# Patient Record
Sex: Female | Born: 1980
Health system: Southern US, Community
[De-identification: ages and names within clinical notes are randomized; demographics above are authoritative.]

## PROBLEM LIST (undated history)

## (undated) ENCOUNTER — Inpatient Hospital Stay (HOSPITAL_COMMUNITY): Payer: Self-pay

## (undated) DIAGNOSIS — O09519 Supervision of elderly primigravida, unspecified trimester: Secondary | ICD-10-CM

## (undated) DIAGNOSIS — N390 Urinary tract infection, site not specified: Secondary | ICD-10-CM

## (undated) DIAGNOSIS — D219 Benign neoplasm of connective and other soft tissue, unspecified: Secondary | ICD-10-CM

## (undated) DIAGNOSIS — T8859XA Other complications of anesthesia, initial encounter: Secondary | ICD-10-CM

## (undated) DIAGNOSIS — T4145XA Adverse effect of unspecified anesthetic, initial encounter: Secondary | ICD-10-CM

## (undated) DIAGNOSIS — Z789 Other specified health status: Secondary | ICD-10-CM

## (undated) HISTORY — DX: Other complications of anesthesia, initial encounter: T88.59XA

## (undated) HISTORY — PX: NO PAST SURGERIES: SHX2092

## (undated) HISTORY — DX: Supervision of elderly primigravida, unspecified trimester: O09.519

---
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
## 2017-01-15 ENCOUNTER — Encounter (HOSPITAL_COMMUNITY): Payer: Self-pay

## 2017-01-15 ENCOUNTER — Ambulatory Visit (HOSPITAL_COMMUNITY)
Admission: EM | Admit: 2017-01-15 | Discharge: 2017-01-15 | Disposition: A | Discharge status: Home / Self Care | Payer: 59 | Attending: Family Medicine | Admitting: Family Medicine

## 2017-01-15 DIAGNOSIS — R300 Dysuria: Secondary | ICD-10-CM

## 2017-01-15 DIAGNOSIS — N390 Urinary tract infection, site not specified: Secondary | ICD-10-CM | POA: Diagnosis not present

## 2017-01-15 LAB — POCT URINALYSIS DIP (DEVICE)
Bilirubin Urine: NEGATIVE
GLUCOSE, UA: NEGATIVE mg/dL
KETONES UR: NEGATIVE mg/dL
NITRITE: POSITIVE — AB
Protein, ur: 100 mg/dL — AB
Specific Gravity, Urine: 1.02 (ref 1.005–1.030)
UROBILINOGEN UA: 0.2 mg/dL (ref 0.0–1.0)
pH: 6 (ref 5.0–8.0)

## 2017-01-15 MED ORDER — CEPHALEXIN 500 MG PO CAPS: 500.0000 mg | ORAL_CAPSULE | Freq: Four times a day (QID) | ORAL | 0 refills | Status: DC

## 2017-01-15 MED ORDER — PHENAZOPYRIDINE HCL 200 MG PO TABS: 200.0000 mg | ORAL_TABLET | Freq: Three times a day (TID) | ORAL | 0 refills | Status: DC

## 2017-01-15 MED FILL — CEPHALEXIN 500 MG CAPSULE: 500 | 7 days supply | Qty: 28 | Fill #0

## 2017-01-15 MED FILL — PHENAZOPYRIDINE 200 MG TAB: 200 | 2 days supply | Qty: 6 | Fill #0

## 2017-01-15 NOTE — ED Triage Notes (Signed)
Having uti symptoms since yesterday. Frequency, urgency and dysuria. No fever. Took aleve

## 2017-01-15 NOTE — ED Provider Notes (Signed)
CSN: [id number]     Arrival date & time 01/15/17  1011 History   First MD Initiated Contact with Patient 01/15/17 1039     Chief Complaint  Patient presents with  . Dysuria   (Consider location/radiation/quality/duration/timing/severity/associated sxs/prior Treatment) Patient c/o dysuria for 2 days.   The history is provided by the patient.  Dysuria  Pain quality:  Aching Pain severity:  Mild Onset quality:  Sudden Duration:  2 days Timing:  Constant Recent urinary tract infections: no   Relieved by:  Nothing Worsened by:  Nothing Ineffective treatments:  None tried Urinary symptoms: discolored urine     History reviewed. No pertinent past medical history. History reviewed. No pertinent surgical history. No family history on file. Social History  Substance Use Topics  . Smoking status: Never Smoker  . Smokeless tobacco: Never Used  . Alcohol use No   OB History    No data available     Review of Systems  Constitutional: Positive for fatigue.  HENT: Negative.   Eyes: Negative.   Cardiovascular: Negative.   Gastrointestinal: Negative.   Endocrine: Negative.   Genitourinary: Positive for dysuria.  Allergic/Immunologic: Negative.   Neurological: Negative.   Hematological: Negative.     Allergies  Patient has no known allergies.  Home Medications   Prior to Admission medications   Medication Sig Start Date End Date Taking? Authorizing Provider  cephALEXin (KEFLEX) 500 MG capsule Take 1 capsule (500 mg total) by mouth 4 (four) times daily. 01/15/17   Lysbeth Penner, FNP  phenazopyridine (PYRIDIUM) 200 MG tablet Take 1 tablet (200 mg total) by mouth 3 (three) times daily. 01/15/17   Lysbeth Penner, FNP   Meds Ordered and Administered this Visit  Medications - No data to display  BP 121/76 (BP Location: Right Arm)   Pulse 84   Temp 98.1 F (36.7 C) (Oral)   Resp 20   LMP 01/15/2017 (Exact Date)   SpO2 100%  No data found.   Physical Exam   Constitutional: She appears well-developed and well-nourished.  HENT:  Head: Normocephalic and atraumatic.  Eyes: Conjunctivae and EOM are normal. Pupils are equal, round, and reactive to light.  Neck: Normal range of motion.  Cardiovascular: Normal rate, regular rhythm and normal heart sounds.   Pulmonary/Chest: Effort normal and breath sounds normal.  Abdominal: Soft. Bowel sounds are normal.  Nursing note and vitals reviewed.   Urgent Care Course     Procedures (including critical care time)  Labs Review Labs Reviewed  POCT URINALYSIS DIP (DEVICE) - Abnormal; Notable for the following:       Result Value   Hgb urine dipstick LARGE (*)    Protein, ur 100 (*)    Nitrite POSITIVE (*)    Leukocytes, UA LARGE (*)    All other components within normal limits  URINE CULTURE    Imaging Review No results found.   Visual Acuity Review  Right Eye Distance:   Left Eye Distance:   Bilateral Distance:    Right Eye Near:   Left Eye Near:    Bilateral Near:         MDM   1. Lower urinary tract infectious disease   2. Dysuria    Keflex 500mg  one po qid x 7 days #28 Pyridium 200mg  one po tid x 2 d #6 UA cx    Lysbeth Penner, FNP 01/15/17 1129

## 2017-01-18 ENCOUNTER — Encounter (HOSPITAL_COMMUNITY): Payer: Self-pay | Admitting: Family Medicine

## 2017-01-18 ENCOUNTER — Ambulatory Visit (HOSPITAL_COMMUNITY)
Admission: EM | Admit: 2017-01-18 | Discharge: 2017-01-18 | Disposition: A | Discharge status: Home / Self Care | Payer: 59 | Attending: Family Medicine | Admitting: Family Medicine

## 2017-01-18 DIAGNOSIS — N760 Acute vaginitis: Secondary | ICD-10-CM

## 2017-01-18 DIAGNOSIS — T7840XA Allergy, unspecified, initial encounter: Secondary | ICD-10-CM | POA: Diagnosis not present

## 2017-01-18 LAB — URINE CULTURE: Culture: >=100000 — AB

## 2017-01-18 MED ORDER — FLUCONAZOLE 150 MG PO TABS: 150.0000 mg | ORAL_TABLET | Freq: Once | ORAL | 0 refills | Status: AC

## 2017-01-18 MED ORDER — METHYLPREDNISOLONE ACETATE 80 MG/ML IJ SUSP
80.0000 mg | Freq: Once | INTRAMUSCULAR | Status: AC
  Administered 2017-01-18: 80 mg via INTRAMUSCULAR

## 2017-01-18 MED ORDER — METHYLPREDNISOLONE ACETATE 80 MG/ML IJ SUSP
INTRAMUSCULAR | Status: AC
  Filled 2017-01-18: qty 1

## 2017-01-18 MED ORDER — NITROFURANTOIN MONOHYD MACRO 100 MG PO CAPS: 100.0000 mg | ORAL_CAPSULE | Freq: Two times a day (BID) | ORAL | 0 refills | Status: DC

## 2017-01-18 NOTE — ED Triage Notes (Signed)
Pt here for rash and itching related to allergic reaction to Keflex. sts she started it  Saturday.

## 2017-01-18 NOTE — Discharge Instructions (Signed)
Please return for continued symptoms.

## 2017-01-18 NOTE — ED Provider Notes (Signed)
[facility]    CSN: [id number] Arrival date & time: 01/18/17  [place]     History   Chief Complaint Chief Complaint  Patient presents with  . Allergic Reaction    HPI Erica Lucas is a 36 y.o. female.   This a 36 year old woman who was in several days ago for urinary tract infection and was prescribed Keflex. She's had 2 problems since: Vaginal itching, and skin itching over her entire body which developed over the last 24 hours. Her urinary symptoms are better.      History reviewed. No pertinent past medical history.  There are no active problems to display for this patient.   History reviewed. No pertinent surgical history.  OB History    No data available       Home Medications    Prior to Admission medications   Medication Sig Start Date End Date Taking? Authorizing Provider  cephALEXin (KEFLEX) 500 MG capsule Take 1 capsule (500 mg total) by mouth 4 (four) times daily. 01/15/17   Lysbeth Penner, FNP  fluconazole (DIFLUCAN) 150 MG tablet Take 1 tablet (150 mg total) by mouth once. Repeat if needed 01/18/17 01/18/17  Robyn Haber, MD  nitrofurantoin, macrocrystal-monohydrate, (MACROBID) 100 MG capsule Take 1 capsule (100 mg total) by mouth 2 (two) times daily. 01/18/17   Robyn Haber, MD  phenazopyridine (PYRIDIUM) 200 MG tablet Take 1 tablet (200 mg total) by mouth 3 (three) times daily. 01/15/17   Lysbeth Penner, FNP    Family History History reviewed. No pertinent family history.  Social History Social History  Substance Use Topics  . Smoking status: Never Smoker  . Smokeless tobacco: Never Used  . Alcohol use No     Allergies   Patient has no known allergies.   Review of Systems Review of Systems  Constitutional: Negative.   Genitourinary: Positive for vaginal pain. Negative for frequency, genital sores, hematuria and urgency.  Skin: Positive for rash.     Physical Exam Triage Vital Signs ED Triage Vitals [01/18/17 1804]  Enc  Vitals Group     BP 149/65     Pulse Rate 83     Resp 18     Temp      Temp src      SpO2 100 %     Weight      Height      Head Circumference      Peak Flow      Pain Score      Pain Loc      Pain Edu?      Excl. in [place]?    No data found.   Updated Vital Signs BP 149/65   Pulse 83   Resp 18   LMP 01/15/2017 (Exact Date)   SpO2 100%    Physical Exam  Constitutional: She is oriented to person, place, and time. She appears well-developed and well-nourished.  HENT:  Head: Normocephalic.  Right Ear: External ear normal.  Left Ear: External ear normal.  Mouth/Throat: Oropharynx is clear and moist.  Eyes: Conjunctivae and EOM are normal. Pupils are equal, round, and reactive to light.  Neck: Normal range of motion. Neck supple.  Pulmonary/Chest: Effort normal.  Musculoskeletal: Normal range of motion.  Neurological: She is alert and oriented to person, place, and time.  Skin: Skin is warm and dry. Rash noted.  Diffuse papular urticarial rash over her face and torso  Nursing note and vitals reviewed.    UC Treatments / Results  Labs (all labs ordered are listed, but only abnormal results are displayed) Labs Reviewed - No data to display  EKG  EKG Interpretation None       Radiology No results found.  Procedures Procedures (including critical care time)  Medications Ordered in UC Medications  methylPREDNISolone acetate (DEPO-MEDROL) injection 80 mg (not administered)     Initial Impression / Assessment and Plan / UC Course  I have reviewed the triage vital signs and the nursing notes.  Pertinent labs & imaging results that were available during my care of the patient were reviewed by me and considered in my medical decision making (see chart for details).     Final Clinical Impressions(s) / UC Diagnoses   Final diagnoses:  Allergic reaction, initial encounter  Acute vaginitis    New Prescriptions New Prescriptions   FLUCONAZOLE (DIFLUCAN)  150 MG TABLET    Take 1 tablet (150 mg total) by mouth once. Repeat if needed   NITROFURANTOIN, MACROCRYSTAL-MONOHYDRATE, (MACROBID) 100 MG CAPSULE    Take 1 capsule (100 mg total) by mouth 2 (two) times daily.     Robyn Haber, MD 01/18/17 [phone]

## 2017-01-19 MED FILL — NITROFURANTOIN MONO-MCR 100: 100 | 5 days supply | Qty: 10 | Fill #0

## 2017-01-19 MED FILL — FLUCONAZOLE 150 MG TABLET: 150 | 2 days supply | Qty: 2 | Fill #0

## 2017-04-20 DIAGNOSIS — N911 Secondary amenorrhea: Secondary | ICD-10-CM | POA: Diagnosis not present

## 2017-04-28 DIAGNOSIS — Z3480 Encounter for supervision of other normal pregnancy, unspecified trimester: Secondary | ICD-10-CM | POA: Diagnosis not present

## 2017-04-28 LAB — OB RESULTS CONSOLE GC/CHLAMYDIA
Chlamydia: NEGATIVE
GC PROBE AMP, GENITAL: NEGATIVE

## 2017-04-28 LAB — OB RESULTS CONSOLE ANTIBODY SCREEN: ANTIBODY SCREEN: NEGATIVE

## 2017-04-28 LAB — OB RESULTS CONSOLE RUBELLA ANTIBODY, IGM: RUBELLA: IMMUNE

## 2017-04-28 LAB — OB RESULTS CONSOLE RPR: RPR: NONREACTIVE

## 2017-04-28 LAB — OB RESULTS CONSOLE ABO/RH: RH Type: POSITIVE

## 2017-04-28 LAB — OB RESULTS CONSOLE HIV ANTIBODY (ROUTINE TESTING): HIV: NONREACTIVE

## 2017-04-28 LAB — OB RESULTS CONSOLE HEPATITIS B SURFACE ANTIGEN: Hepatitis B Surface Ag: NEGATIVE

## 2017-05-01 ENCOUNTER — Encounter (HOSPITAL_COMMUNITY): Payer: Self-pay

## 2017-05-01 ENCOUNTER — Inpatient Hospital Stay (HOSPITAL_COMMUNITY)
Admission: AD | Admit: 2017-05-01 | Discharge: 2017-05-01 | Disposition: A | Discharge status: Home / Self Care | Payer: 59 | Source: Ambulatory Visit | Attending: Obstetrics and Gynecology | Admitting: Obstetrics and Gynecology

## 2017-05-01 ENCOUNTER — Inpatient Hospital Stay (HOSPITAL_COMMUNITY): Payer: 59

## 2017-05-01 DIAGNOSIS — Z3A09 9 weeks gestation of pregnancy: Secondary | ICD-10-CM | POA: Insufficient documentation

## 2017-05-01 DIAGNOSIS — Z3A10 10 weeks gestation of pregnancy: Secondary | ICD-10-CM | POA: Diagnosis not present

## 2017-05-01 DIAGNOSIS — R1032 Left lower quadrant pain: Secondary | ICD-10-CM | POA: Insufficient documentation

## 2017-05-01 DIAGNOSIS — O26891 Other specified pregnancy related conditions, first trimester: Secondary | ICD-10-CM | POA: Insufficient documentation

## 2017-05-01 DIAGNOSIS — O3411 Maternal care for benign tumor of corpus uteri, first trimester: Secondary | ICD-10-CM | POA: Insufficient documentation

## 2017-05-01 DIAGNOSIS — R109 Unspecified abdominal pain: Secondary | ICD-10-CM

## 2017-05-01 DIAGNOSIS — D259 Leiomyoma of uterus, unspecified: Secondary | ICD-10-CM | POA: Diagnosis not present

## 2017-05-01 HISTORY — DX: Other specified health status: Z78.9

## 2017-05-01 LAB — CBC
HEMATOCRIT: 32.5 % — AB (ref 36.0–46.0)
Hemoglobin: 11.3 g/dL — ABNORMAL LOW (ref 12.0–15.0)
MCH: 29 pg (ref 26.0–34.0)
MCHC: 34.8 g/dL (ref 30.0–36.0)
MCV: 83.5 fL (ref 78.0–100.0)
PLATELETS: 155 10*3/uL (ref 150–400)
RBC: 3.89 MIL/uL (ref 3.87–5.11)
RDW: 13.8 % (ref 11.5–15.5)
WBC: 8.9 10*3/uL (ref 4.0–10.5)

## 2017-05-01 LAB — URINALYSIS, ROUTINE W REFLEX MICROSCOPIC
BILIRUBIN URINE: NEGATIVE
GLUCOSE, UA: NEGATIVE mg/dL
KETONES UR: 80 mg/dL — AB
NITRITE: NEGATIVE
PH: 6 (ref 5.0–8.0)
PROTEIN: NEGATIVE mg/dL
Specific Gravity, Urine: 1.013 (ref 1.005–1.030)

## 2017-05-01 LAB — COMPREHENSIVE METABOLIC PANEL
ALK PHOS: 38 U/L (ref 38–126)
ALT: 10 U/L — ABNORMAL LOW (ref 14–54)
ANION GAP: 10 (ref 5–15)
AST: 16 U/L (ref 15–41)
Albumin: 3.5 g/dL (ref 3.5–5.0)
BUN: 8 mg/dL (ref 6–20)
CALCIUM: 9.1 mg/dL (ref 8.9–10.3)
CO2: 22 mmol/L (ref 22–32)
Chloride: 102 mmol/L (ref 101–111)
Creatinine, Ser: 0.62 mg/dL (ref 0.44–1.00)
Glucose, Bld: 94 mg/dL (ref 65–99)
Potassium: 3.4 mmol/L — ABNORMAL LOW (ref 3.5–5.1)
Sodium: 134 mmol/L — ABNORMAL LOW (ref 135–145)
Total Bilirubin: 0.7 mg/dL (ref 0.3–1.2)
Total Protein: 7.1 g/dL (ref 6.5–8.1)

## 2017-05-01 LAB — WET PREP, GENITAL
Clue Cells Wet Prep HPF POC: NONE SEEN
SPERM: NONE SEEN
TRICH WET PREP: NONE SEEN

## 2017-05-01 LAB — POCT PREGNANCY, URINE: Preg Test, Ur: POSITIVE — AB

## 2017-05-01 MED ORDER — DEXTROSE 5 % IN LACTATED RINGERS IV BOLUS
1000.0000 mL | Freq: Once | INTRAVENOUS | Status: AC
Start: 2017-05-01 — End: 2017-05-01
  Administered 2017-05-01: 1000 mL via INTRAVENOUS

## 2017-05-01 MED ORDER — PANTOPRAZOLE SODIUM 40 MG IV SOLR
40.0000 mg | Freq: Once | INTRAVENOUS | Status: AC
  Administered 2017-05-01: 40 mg via INTRAVENOUS
  Filled 2017-05-01: qty 40

## 2017-05-01 NOTE — Discharge Instructions (Signed)
Uterine Fibroids Uterine fibroids are tissue masses (tumors) that can develop in the womb (uterus). They are also called leiomyomas. This type of tumor is not cancerous (benign) and does not spread to other parts of the body outside of the pelvic area, which is between the hip bones. Occasionally, fibroids may develop in the fallopian tubes, in the cervix, or on the support structures (ligaments) that surround the uterus. You can have one or many fibroids. Fibroids can vary in size, weight, and where they grow in the uterus. Some can become quite large. Most fibroids do not require medical treatment. What are the causes? A fibroid can develop when a single uterine cell keeps growing (replicating). Most cells in the human body have a control mechanism that keeps them from replicating without control. What are the signs or symptoms? Symptoms may include:  Heavy bleeding during your period.  Bleeding or spotting between periods.  Pelvic pain and pressure.  Bladder problems, such as needing to urinate more often (urinary frequency) or urgently.  Inability to reproduce offspring (infertility).  Miscarriages.  How is this diagnosed? Uterine fibroids are diagnosed through a physical exam. Your health care provider may feel the lumpy tumors during a pelvic exam. Ultrasonography and an MRI may be done to determine the size, location, and number of fibroids. How is this treated? Treatment may include:  Watchful waiting. This involves getting the fibroid checked by your health care provider to see if it grows or shrinks. Follow your health care provider's recommendations for how often to have this checked.  Hormone medicines. These can be taken by mouth or given through an intrauterine device (IUD).  Surgery. ? Removing the fibroids (myomectomy) or the uterus (hysterectomy). ? Removing blood supply to the fibroids (uterine artery embolization).  If fibroids interfere with your fertility and you  want to become pregnant, your health care provider may recommend having the fibroids removed. Follow these instructions at home:  Keep all follow-up visits as directed by your health care provider. This is important.  Take over-the-counter and prescription medicines only as told by your health care provider. ? If you were prescribed a hormone treatment, take the hormone medicines exactly as directed.  Ask your health care provider about taking iron pills and increasing the amount of dark green, leafy vegetables in your diet. These actions can help to boost your blood iron levels, which may be affected by heavy menstrual bleeding.  Pay close attention to your period and tell your health care provider about any changes, such as: ? Increased blood flow that requires you to use more pads or tampons than usual per month. ? A change in the number of days that your period lasts per month. ? A change in symptoms that are associated with your period, such as abdominal cramping or back pain. Contact a health care provider if:  You have pelvic pain, back pain, or abdominal cramps that cannot be controlled with medicines.  You have an increase in bleeding between and during periods.  You soak tampons or pads in a half hour or less.  You feel lightheaded, extra tired, or weak. Get help right away if:  You faint.  You have a sudden increase in pelvic pain. This information is not intended to replace advice given to you by your health care provider. Make sure you discuss any questions you have with your health care provider. Document Released: 10/30/2000 Document Revised: 07/02/2016 Document Reviewed: 05/01/2014 Elsevier Interactive Patient Education  2018 [employer]  

## 2017-05-01 NOTE — MAU Note (Signed)
Low abd pain, very sharp, goes into thighs - started Wednesday. Saw OB on that day and was told might be stretching.  No bleeding. Creamy discharge.

## 2017-05-01 NOTE — MAU Provider Note (Signed)
Chief Complaint: Abdominal Pain   None     SUBJECTIVE HPI: Erica Lucas is a 36 y.o. G1P0 at [redacted]w[redacted]d by LMP who presents to maternity admissions reporting LLQ pain x 5 days, constant, and gradually worsening. She has tried Tylenol which does not help.  She had an [place] in the office 3 days ago which was normal.  Nothing makes the pain better or worse. There are no associated symptoms. The patient is tearful and breathing through pain in the MAU. She denies vaginal bleeding, vaginal itching/burning, urinary symptoms, h/a, dizziness, n/v, or fever/chills.     HPI  Past Medical History:  Diagnosis Date  . Medical history non-contributory    Past Surgical History:  Procedure Laterality Date  . NO PAST SURGERIES     Social History   Social History  . Marital status: Married    Spouse name: N/A  . Number of children: N/A  . Years of education: N/A   Occupational History  . Not on file.   Social History Main Topics  . Smoking status: Never Smoker  . Smokeless tobacco: Never Used  . Alcohol use No  . Drug use: Unknown  . Sexual activity: Not on file   Other Topics Concern  . Not on file   Social History Narrative  . No narrative on file   No current facility-administered medications on file prior to encounter.    Current Outpatient Prescriptions on File Prior to Encounter  Medication Sig Dispense Refill  . cephALEXin (KEFLEX) 500 MG capsule Take 1 capsule (500 mg total) by mouth 4 (four) times daily. 28 capsule 0  . nitrofurantoin, macrocrystal-monohydrate, (MACROBID) 100 MG capsule Take 1 capsule (100 mg total) by mouth 2 (two) times daily. 10 capsule 0  . phenazopyridine (PYRIDIUM) 200 MG tablet Take 1 tablet (200 mg total) by mouth 3 (three) times daily. 6 tablet 0   Allergies  Allergen Reactions  . Keflex [Cephalexin] Rash    ROS:  Review of Systems  Constitutional: Negative for chills, fatigue and fever.  Respiratory: Negative for shortness of breath.    Cardiovascular: Negative for chest pain.  Gastrointestinal: Positive for abdominal pain.  Genitourinary: Positive for pelvic pain. Negative for difficulty urinating, dysuria, flank pain, vaginal bleeding, vaginal discharge and vaginal pain.  Neurological: Negative for dizziness and headaches.  Psychiatric/Behavioral: Negative.      I have reviewed patient's Past Medical Hx, Surgical Hx, Family Hx, Social Hx, medications and allergies.   Physical Exam  Patient Vitals for the past 24 hrs:  BP Temp Temp src Pulse Resp SpO2 Height  05/01/17 0626 108/67 99.6 F (37.6 C) Oral (!) 101 18 99 % 5\' 7"  (1.702 m)   Constitutional: Well-developed, well-nourished female in no acute distress.  Cardiovascular: normal rate Respiratory: normal effort GI: Abd soft, non-tender. Pos BS x 4 MS: Extremities nontender, no edema, normal ROM Neurologic: Alert and oriented x 4.  GU: Neg CVAT.  PELVIC EXAM: Wet prep/GC collected by blind swab   LAB RESULTS Results for orders placed or performed during the hospital encounter of 05/01/17 (from the past 24 hour(s))  Urinalysis, Routine w reflex microscopic     Status: Abnormal   Collection Time: 05/01/17  6:10 AM  Result Value Ref Range   Color, Urine YELLOW YELLOW   APPearance HAZY (A) CLEAR   Specific Gravity, Urine 1.013 1.005 - 1.030   pH 6.0 5.0 - 8.0   Glucose, UA NEGATIVE NEGATIVE mg/dL   Hgb urine dipstick SMALL (A) NEGATIVE  Bilirubin Urine NEGATIVE NEGATIVE   Ketones, ur 80 (A) NEGATIVE mg/dL   Protein, ur NEGATIVE NEGATIVE mg/dL   Nitrite NEGATIVE NEGATIVE   Leukocytes, UA TRACE (A) NEGATIVE   RBC / HPF 0-5 0 - 5 RBC/hpf   WBC, UA 0-5 0 - 5 WBC/hpf   Bacteria, UA MANY (A) NONE SEEN   Squamous Epithelial / LPF 0-5 (A) NONE SEEN   Mucous PRESENT   Pregnancy, urine POC     Status: Abnormal   Collection Time: 05/01/17  6:45 AM  Result Value Ref Range   Preg Test, Ur POSITIVE (A) NEGATIVE  CBC     Status: Abnormal   Collection Time:  05/01/17  8:20 AM  Result Value Ref Range   WBC 8.9 4.0 - 10.5 K/uL   RBC 3.89 3.87 - 5.11 MIL/uL   Hemoglobin 11.3 (L) 12.0 - 15.0 g/dL   HCT 32.5 (L) 36.0 - 46.0 %   MCV 83.5 78.0 - 100.0 fL   MCH 29.0 26.0 - 34.0 pg   MCHC 34.8 30.0 - 36.0 g/dL   RDW 13.8 11.5 - 15.5 %   Platelets 155 150 - 400 K/uL       IMAGING Bedside US with FHR 176, fetal movement noted, subjectively normal fluid level  No results found.  Report to Jorje Guild, NP, with [place] results pending   Fatima Blank Certified Nurse-Midwife 05/01/2017  8:43 AM   U/a shows 80 ketones & pt reports not eating or drinking much d/t nausea. IV fluids bolus of D5LR given.  Ultrasound shows IUP with cardiac activity & 10 cm uterine fibroid Discussed results with Dr. Matthew Saras. Patient has f/u appointment with Dr. Corinna Capra next week. Ok to discharge home.   A: 1. Uterine fibroids affecting pregnancy in first trimester   2. LLQ pain   3. Abdominal pain during pregnancy, first trimester    P: Discharge home Take tylenol prn pain Discussed reasons to return to MAU Keep f/u with OB  Jorje Guild, NP

## 2017-05-03 DIAGNOSIS — O26891 Other specified pregnancy related conditions, first trimester: Secondary | ICD-10-CM | POA: Diagnosis not present

## 2017-05-03 DIAGNOSIS — Z3A10 10 weeks gestation of pregnancy: Secondary | ICD-10-CM | POA: Diagnosis not present

## 2017-05-03 DIAGNOSIS — Z3400 Encounter for supervision of normal first pregnancy, unspecified trimester: Secondary | ICD-10-CM | POA: Diagnosis not present

## 2017-05-03 DIAGNOSIS — R1030 Lower abdominal pain, unspecified: Secondary | ICD-10-CM | POA: Diagnosis not present

## 2017-05-03 LAB — GC/CHLAMYDIA PROBE AMP (~~LOC~~) NOT AT ARMC
CHLAMYDIA, DNA PROBE: NEGATIVE
Neisseria Gonorrhea: NEGATIVE

## 2017-05-07 DIAGNOSIS — Z3480 Encounter for supervision of other normal pregnancy, unspecified trimester: Secondary | ICD-10-CM | POA: Diagnosis not present

## 2017-05-07 DIAGNOSIS — Z3401 Encounter for supervision of normal first pregnancy, first trimester: Secondary | ICD-10-CM | POA: Diagnosis not present

## 2017-05-07 DIAGNOSIS — Z36 Encounter for antenatal screening for chromosomal anomalies: Secondary | ICD-10-CM | POA: Diagnosis not present

## 2017-05-17 DIAGNOSIS — O09511 Supervision of elderly primigravida, first trimester: Secondary | ICD-10-CM | POA: Diagnosis not present

## 2017-05-17 DIAGNOSIS — Z363 Encounter for antenatal screening for malformations: Secondary | ICD-10-CM | POA: Diagnosis not present

## 2017-05-17 DIAGNOSIS — Z3A12 12 weeks gestation of pregnancy: Secondary | ICD-10-CM | POA: Diagnosis not present

## 2017-05-17 DIAGNOSIS — Z3682 Encounter for antenatal screening for nuchal translucency: Secondary | ICD-10-CM | POA: Diagnosis not present

## 2017-06-23 DIAGNOSIS — Z3480 Encounter for supervision of other normal pregnancy, unspecified trimester: Secondary | ICD-10-CM | POA: Diagnosis not present

## 2017-07-11 ENCOUNTER — Encounter (HOSPITAL_COMMUNITY): Payer: Self-pay | Admitting: *Deleted

## 2017-07-11 ENCOUNTER — Inpatient Hospital Stay (HOSPITAL_COMMUNITY)
Admission: AD | Admit: 2017-07-11 | Discharge: 2017-07-12 | Disposition: A | Discharge status: Home / Self Care | Payer: 59 | Source: Ambulatory Visit | Attending: Obstetrics & Gynecology | Admitting: Obstetrics & Gynecology

## 2017-07-11 DIAGNOSIS — Z3A20 20 weeks gestation of pregnancy: Secondary | ICD-10-CM | POA: Diagnosis not present

## 2017-07-11 DIAGNOSIS — O9989 Other specified diseases and conditions complicating pregnancy, childbirth and the puerperium: Secondary | ICD-10-CM | POA: Diagnosis not present

## 2017-07-11 DIAGNOSIS — R102 Pelvic and perineal pain: Secondary | ICD-10-CM | POA: Diagnosis not present

## 2017-07-11 DIAGNOSIS — R109 Unspecified abdominal pain: Secondary | ICD-10-CM | POA: Diagnosis not present

## 2017-07-11 DIAGNOSIS — O3412 Maternal care for benign tumor of corpus uteri, second trimester: Secondary | ICD-10-CM | POA: Insufficient documentation

## 2017-07-11 DIAGNOSIS — O3410 Maternal care for benign tumor of corpus uteri, unspecified trimester: Secondary | ICD-10-CM

## 2017-07-11 DIAGNOSIS — Z3A19 19 weeks gestation of pregnancy: Secondary | ICD-10-CM

## 2017-07-11 DIAGNOSIS — D259 Leiomyoma of uterus, unspecified: Secondary | ICD-10-CM

## 2017-07-11 HISTORY — DX: Benign neoplasm of connective and other soft tissue, unspecified: D21.9

## 2017-07-11 HISTORY — DX: Urinary tract infection, site not specified: N39.0

## 2017-07-11 LAB — URINALYSIS, ROUTINE W REFLEX MICROSCOPIC
Bilirubin Urine: NEGATIVE
Glucose, UA: NEGATIVE mg/dL
HGB URINE DIPSTICK: NEGATIVE
Ketones, ur: NEGATIVE mg/dL
LEUKOCYTES UA: NEGATIVE
Nitrite: NEGATIVE
PROTEIN: NEGATIVE mg/dL
Specific Gravity, Urine: 1.013 (ref 1.005–1.030)
pH: 7 (ref 5.0–8.0)

## 2017-07-11 MED ORDER — ACETAMINOPHEN 500 MG PO TABS
1000.0000 mg | ORAL_TABLET | Freq: Once | ORAL | Status: AC
  Administered 2017-07-12: 500 mg via ORAL
  Filled 2017-07-11: qty 2

## 2017-07-11 NOTE — MAU Provider Note (Signed)
History     CSN: [id number]  Arrival date and time: 07/11/17 2253   First Provider Initiated Contact with Patient 07/11/17 2333      Chief Complaint  Patient presents with  . Abdominal Pain    HPI: Erica Lucas is a 36 y.o. G1P0 with IUP at [redacted]w[redacted]d who presents to maternity admissions reporting abdominal pain. She reports cramping pain on her mid-abdomen which started yesterday. She reports she thought this was d/t activity yesterday, but since around 5pm today, pain has become severe, and coming every 10 minutes. Reports that when she gets pain, she also get some rectal pain and the urge to urinate. Denies dysuria, hematuria or urinary frequency. Also denies vaginal discharge or vaginal bleeding, fever, chills, nausea, vomiting, or diarrhea. She reports she has been constipation since starting PNV, but had a small BM today.   Past obstetric history: OB History  Gravida Para Term Preterm AB Living  1            SAB TAB Ectopic Multiple Live Births               # Outcome Date GA Lbr Len/2nd Weight Sex Delivery Anes PTL Lv  1 Current               Past Medical History:  Diagnosis Date  . Fibroid   . Medical history non-contributory   . UTI (urinary tract infection)     Past Surgical History:  Procedure Laterality Date  . NO PAST SURGERIES      Family History  Problem Relation Age of Onset  . Hypertension Mother   . Diabetes Mother     Social History  Substance Use Topics  . Smoking status: Never Smoker  . Smokeless tobacco: Never Used  . Alcohol use No    Allergies:  Allergies  Allergen Reactions  . Keflex [Cephalexin] Rash    Prescriptions Prior to Admission  Medication Sig Dispense Refill Last Dose  . Prenatal Vit-Fe Fumarate-FA (PRENATAL MULTIVITAMIN) TABS tablet Take 1 tablet by mouth daily at 12 noon.   Past Week at Unknown time    Review of Systems - Negative except for what is mentioned in HPI.  Physical Exam   Blood pressure 113/60, pulse (!)  102, temperature 98.4 F (36.9 C), resp. rate 18, height 5\' 6"  (1.676 m), weight 152 lb (68.9 kg), last menstrual period 01/15/2017, SpO2 100 %.  Constitutional: Patient initially in NAD, but intermittently in pain, moaning, and very uncomfortable with pelvic exam Cardiovascular: normal rate, regular rythm Respiratory: normal effort, lungs CTAB.  GI: Abd soft, gravid, fundus about 4cm above umbilicus. Diffuse TTP, no rebound or guarding. Negative obturator and psoas signs. GU: Neg CVAT. Pelvic: NEFG. Whitish discharge in vaginal vault, otherwise normal appearing.  vagina mucosa; cervix visually closed. No CMT. Mild R adnexal tenderness MSK: Extremities nontender, no edema, normal ROM Neurologic: Alert and oriented x 4. Psych: Normal mood and affect   +FHT by doppler  MAU Course  Procedures  MDM 12:12 AM -- Exam as above. Wet prep and GC/Chlamydia collected. CBC, CMP, lipase, and U/S ordered.  01:00 AM -- Labs and ultrasound reviewed Results for orders placed or performed during the hospital encounter of 07/11/17  Wet prep, genital  Result Value Ref Range   Yeast Wet Prep HPF POC NONE SEEN NONE SEEN   Trich, Wet Prep NONE SEEN NONE SEEN   Clue Cells Wet Prep HPF POC NONE SEEN NONE SEEN   WBC, Wet Prep  HPF POC FEW (A) NONE SEEN   Sperm NONE SEEN   Urinalysis, Routine w reflex microscopic  Result Value Ref Range   Color, Urine STRAW (A) YELLOW   APPearance CLEAR CLEAR   Specific Gravity, Urine 1.013 1.005 - 1.030   pH 7.0 5.0 - 8.0   Glucose, UA NEGATIVE NEGATIVE mg/dL   Hgb urine dipstick NEGATIVE NEGATIVE   Bilirubin Urine NEGATIVE NEGATIVE   Ketones, ur NEGATIVE NEGATIVE mg/dL   Protein, ur NEGATIVE NEGATIVE mg/dL   Nitrite NEGATIVE NEGATIVE   Leukocytes, UA NEGATIVE NEGATIVE  Comprehensive metabolic panel  Result Value Ref Range   Sodium 135 135 - 145 mmol/L   Potassium 3.6 3.5 - 5.1 mmol/L   Chloride 106 101 - 111 mmol/L   CO2 22 22 - 32 mmol/L   Glucose, Bld 98 65 -  99 mg/dL   BUN 8 6 - 20 mg/dL   Creatinine, Ser 0.48 0.44 - 1.00 mg/dL   Calcium 8.3 (L) 8.9 - 10.3 mg/dL   Total Protein 5.9 (L) 6.5 - 8.1 g/dL   Albumin 2.9 (L) 3.5 - 5.0 g/dL   AST 20 15 - 41 U/L   ALT 13 (L) 14 - 54 U/L   Alkaline Phosphatase 43 38 - 126 U/L   Total Bilirubin 0.5 0.3 - 1.2 mg/dL   GFR calc non Af Amer >60 >60 mL/min   GFR calc Af Amer >60 >60 mL/min   Anion gap 7 5 - 15  Lipase, blood  Result Value Ref Range   Lipase 34 11 - 51 U/L  CBC with Differential/Platelet  Result Value Ref Range   WBC 8.2 4.0 - 10.5 K/uL   RBC 3.66 (L) 3.87 - 5.11 MIL/uL   Hemoglobin 11.0 (L) 12.0 - 15.0 g/dL   HCT 32.2 (L) 36.0 - 46.0 %   MCV 88.0 78.0 - 100.0 fL   MCH 30.1 26.0 - 34.0 pg   MCHC 34.2 30.0 - 36.0 g/dL   RDW 16.5 (H) 11.5 - 15.5 %   Platelets 152 150 - 400 K/uL   Neutrophils Relative % 81 %   Neutro Abs 6.7 1.7 - 7.7 K/uL   Lymphocytes Relative 15 %   Lymphs Abs 1.2 0.7 - 4.0 K/uL   Monocytes Relative 3 %   Monocytes Absolute 0.2 0.1 - 1.0 K/uL   Eosinophils Relative 1 %   Eosinophils Absolute 0.0 0.0 - 0.7 K/uL   Basophils Relative 0 %   Basophils Absolute 0.0 0.0 - 0.1 K/uL   Labs wnl, no leukocytosis; CMP and lipase wnl. U/S unremarkable other than known uterine fibroid. CL 4 cm. Placenta anterior w/o sign of abruption. Adnexa wnl.   Discussed with Dr. Lynnette Caffey, who recommends trial of ibuprofen x 2 days. Patient has follow up tomorrow.   Assessment and Plan  Assessment: 1. Uterine fibroid in pregnancy   2. Pelvic pain   3. Pregnancy with 19 completed weeks gestation     Plan: --Ibuprofen for fibroid pain as recommended by Dr. Lynnette Caffey. --Discharge home in stable condition.  --PTL precautions --Follow up with OB as scheduled tomorrow   Kipp Shank, Jenne Pane, MD 07/11/2017 11:58 PM

## 2017-07-11 NOTE — MAU Note (Signed)
Pt is reporting contraction like pain and rectal pressure that started around 5pm. States the pain is around every 10 mins. Pt denies vaginal bleeding or discharge. Pt denies urinary s/s other than "really concentrated urine." Pt reports constipation-last BM was today.

## 2017-07-12 ENCOUNTER — Inpatient Hospital Stay (HOSPITAL_COMMUNITY): Payer: 59

## 2017-07-12 DIAGNOSIS — Z363 Encounter for antenatal screening for malformations: Secondary | ICD-10-CM | POA: Diagnosis not present

## 2017-07-12 DIAGNOSIS — D259 Leiomyoma of uterus, unspecified: Secondary | ICD-10-CM | POA: Diagnosis not present

## 2017-07-12 DIAGNOSIS — R102 Pelvic and perineal pain: Secondary | ICD-10-CM | POA: Diagnosis not present

## 2017-07-12 DIAGNOSIS — O9989 Other specified diseases and conditions complicating pregnancy, childbirth and the puerperium: Secondary | ICD-10-CM | POA: Diagnosis not present

## 2017-07-12 DIAGNOSIS — Z3400 Encounter for supervision of normal first pregnancy, unspecified trimester: Secondary | ICD-10-CM | POA: Diagnosis not present

## 2017-07-12 DIAGNOSIS — Z3689 Encounter for other specified antenatal screening: Secondary | ICD-10-CM | POA: Diagnosis not present

## 2017-07-12 DIAGNOSIS — Z3A19 19 weeks gestation of pregnancy: Secondary | ICD-10-CM | POA: Diagnosis not present

## 2017-07-12 DIAGNOSIS — O26892 Other specified pregnancy related conditions, second trimester: Secondary | ICD-10-CM | POA: Diagnosis not present

## 2017-07-12 DIAGNOSIS — Z3A20 20 weeks gestation of pregnancy: Secondary | ICD-10-CM | POA: Diagnosis not present

## 2017-07-12 DIAGNOSIS — O3412 Maternal care for benign tumor of corpus uteri, second trimester: Secondary | ICD-10-CM | POA: Diagnosis not present

## 2017-07-12 DIAGNOSIS — R109 Unspecified abdominal pain: Secondary | ICD-10-CM | POA: Diagnosis not present

## 2017-07-12 LAB — COMPREHENSIVE METABOLIC PANEL
ALK PHOS: 43 U/L (ref 38–126)
ALT: 13 U/L — ABNORMAL LOW (ref 14–54)
ANION GAP: 7 (ref 5–15)
AST: 20 U/L (ref 15–41)
Albumin: 2.9 g/dL — ABNORMAL LOW (ref 3.5–5.0)
BILIRUBIN TOTAL: 0.5 mg/dL (ref 0.3–1.2)
BUN: 8 mg/dL (ref 6–20)
CALCIUM: 8.3 mg/dL — AB (ref 8.9–10.3)
CO2: 22 mmol/L (ref 22–32)
Chloride: 106 mmol/L (ref 101–111)
Creatinine, Ser: 0.48 mg/dL (ref 0.44–1.00)
Glucose, Bld: 98 mg/dL (ref 65–99)
Potassium: 3.6 mmol/L (ref 3.5–5.1)
Sodium: 135 mmol/L (ref 135–145)
TOTAL PROTEIN: 5.9 g/dL — AB (ref 6.5–8.1)

## 2017-07-12 LAB — CBC WITH DIFFERENTIAL/PLATELET
BASOS PCT: 0 %
Basophils Absolute: 0 10*3/uL (ref 0.0–0.1)
EOS ABS: 0 10*3/uL (ref 0.0–0.7)
Eosinophils Relative: 1 %
HCT: 32.2 % — ABNORMAL LOW (ref 36.0–46.0)
HEMOGLOBIN: 11 g/dL — AB (ref 12.0–15.0)
Lymphocytes Relative: 15 %
Lymphs Abs: 1.2 10*3/uL (ref 0.7–4.0)
MCH: 30.1 pg (ref 26.0–34.0)
MCHC: 34.2 g/dL (ref 30.0–36.0)
MCV: 88 fL (ref 78.0–100.0)
Monocytes Absolute: 0.2 10*3/uL (ref 0.1–1.0)
Monocytes Relative: 3 %
NEUTROS PCT: 81 %
Neutro Abs: 6.7 10*3/uL (ref 1.7–7.7)
Platelets: 152 10*3/uL (ref 150–400)
RBC: 3.66 MIL/uL — AB (ref 3.87–5.11)
RDW: 16.5 % — ABNORMAL HIGH (ref 11.5–15.5)
WBC: 8.2 10*3/uL (ref 4.0–10.5)

## 2017-07-12 LAB — WET PREP, GENITAL
Clue Cells Wet Prep HPF POC: NONE SEEN
SPERM: NONE SEEN
Trich, Wet Prep: NONE SEEN
Yeast Wet Prep HPF POC: NONE SEEN

## 2017-07-12 LAB — GC/CHLAMYDIA PROBE AMP (~~LOC~~) NOT AT ARMC
CHLAMYDIA, DNA PROBE: NEGATIVE
NEISSERIA GONORRHEA: NEGATIVE

## 2017-07-12 LAB — LIPASE, BLOOD: LIPASE: 34 U/L (ref 11–51)

## 2017-07-12 MED ORDER — BETAMETHASONE SOD PHOS & ACET 6 (3-3) MG/ML IJ SUSP: 12.0000 mg | Freq: Once | INTRAMUSCULAR | Status: DC

## 2017-07-12 MED ORDER — IBUPROFEN 600 MG PO TABS: 600.0000 mg | ORAL_TABLET | Freq: Four times a day (QID) | ORAL | 0 refills | Status: DC | PRN

## 2017-07-13 LAB — URINE CULTURE: Culture: <10000 — AB

## 2017-07-21 DIAGNOSIS — Z3A21 21 weeks gestation of pregnancy: Secondary | ICD-10-CM | POA: Diagnosis not present

## 2017-07-21 DIAGNOSIS — O26892 Other specified pregnancy related conditions, second trimester: Secondary | ICD-10-CM | POA: Diagnosis not present

## 2017-07-21 DIAGNOSIS — O3412 Maternal care for benign tumor of corpus uteri, second trimester: Secondary | ICD-10-CM | POA: Diagnosis not present

## 2017-07-21 DIAGNOSIS — Z3400 Encounter for supervision of normal first pregnancy, unspecified trimester: Secondary | ICD-10-CM | POA: Diagnosis not present

## 2017-08-30 DIAGNOSIS — Z3480 Encounter for supervision of other normal pregnancy, unspecified trimester: Secondary | ICD-10-CM | POA: Diagnosis not present

## 2017-08-30 DIAGNOSIS — Z3400 Encounter for supervision of normal first pregnancy, unspecified trimester: Secondary | ICD-10-CM | POA: Diagnosis not present

## 2017-08-30 DIAGNOSIS — Z23 Encounter for immunization: Secondary | ICD-10-CM | POA: Diagnosis not present

## 2017-09-15 DIAGNOSIS — Z3A29 29 weeks gestation of pregnancy: Secondary | ICD-10-CM | POA: Diagnosis not present

## 2017-09-15 DIAGNOSIS — O3413 Maternal care for benign tumor of corpus uteri, third trimester: Secondary | ICD-10-CM | POA: Diagnosis not present

## 2017-09-15 DIAGNOSIS — Z3400 Encounter for supervision of normal first pregnancy, unspecified trimester: Secondary | ICD-10-CM | POA: Diagnosis not present

## 2017-09-29 DIAGNOSIS — D649 Anemia, unspecified: Secondary | ICD-10-CM | POA: Diagnosis not present

## 2017-09-29 DIAGNOSIS — Z3403 Encounter for supervision of normal first pregnancy, third trimester: Secondary | ICD-10-CM | POA: Diagnosis not present

## 2017-10-14 DIAGNOSIS — Z23 Encounter for immunization: Secondary | ICD-10-CM | POA: Diagnosis not present

## 2017-10-14 DIAGNOSIS — Z3400 Encounter for supervision of normal first pregnancy, unspecified trimester: Secondary | ICD-10-CM | POA: Diagnosis not present

## 2017-10-14 DIAGNOSIS — Z3A33 33 weeks gestation of pregnancy: Secondary | ICD-10-CM | POA: Diagnosis not present

## 2017-10-14 DIAGNOSIS — O3413 Maternal care for benign tumor of corpus uteri, third trimester: Secondary | ICD-10-CM | POA: Diagnosis not present

## 2017-10-28 DIAGNOSIS — Z3685 Encounter for antenatal screening for Streptococcus B: Secondary | ICD-10-CM | POA: Diagnosis not present

## 2017-11-02 LAB — OB RESULTS CONSOLE GBS: STREP GROUP B AG: NEGATIVE

## 2017-11-26 ENCOUNTER — Encounter (HOSPITAL_COMMUNITY): Payer: Self-pay | Admitting: *Deleted

## 2017-11-26 ENCOUNTER — Telehealth (HOSPITAL_COMMUNITY): Payer: Self-pay | Admitting: *Deleted

## 2017-11-26 NOTE — Telephone Encounter (Signed)
Preadmission screen  

## 2017-12-02 ENCOUNTER — Inpatient Hospital Stay (HOSPITAL_COMMUNITY): Payer: 59

## 2017-12-06 ENCOUNTER — Inpatient Hospital Stay (HOSPITAL_COMMUNITY)
Admission: RE | Admit: 2017-12-06 | Discharge: 2017-12-08 | DRG: 788 | Disposition: A | Discharge status: Home / Self Care | Payer: 59 | Source: Ambulatory Visit | Attending: Obstetrics and Gynecology | Admitting: Obstetrics and Gynecology

## 2017-12-06 ENCOUNTER — Encounter (HOSPITAL_COMMUNITY)
Admission: RE | Disposition: A | Discharge status: Home / Self Care | Payer: Self-pay | Source: Ambulatory Visit | Attending: Obstetrics and Gynecology

## 2017-12-06 ENCOUNTER — Encounter (HOSPITAL_COMMUNITY): Payer: Self-pay

## 2017-12-06 ENCOUNTER — Inpatient Hospital Stay (HOSPITAL_COMMUNITY): Payer: 59 | Admitting: Anesthesiology

## 2017-12-06 DIAGNOSIS — Z883 Allergy status to other anti-infective agents status: Secondary | ICD-10-CM | POA: Diagnosis not present

## 2017-12-06 DIAGNOSIS — O3413 Maternal care for benign tumor of corpus uteri, third trimester: Principal | ICD-10-CM | POA: Diagnosis present

## 2017-12-06 DIAGNOSIS — D259 Leiomyoma of uterus, unspecified: Secondary | ICD-10-CM | POA: Diagnosis present

## 2017-12-06 DIAGNOSIS — Z3A40 40 weeks gestation of pregnancy: Secondary | ICD-10-CM

## 2017-12-06 DIAGNOSIS — Z3483 Encounter for supervision of other normal pregnancy, third trimester: Secondary | ICD-10-CM | POA: Diagnosis present

## 2017-12-06 DIAGNOSIS — Z3490 Encounter for supervision of normal pregnancy, unspecified, unspecified trimester: Secondary | ICD-10-CM

## 2017-12-06 LAB — CBC
HCT: 34.1 % — ABNORMAL LOW (ref 36.0–46.0)
HEMATOCRIT: 36.4 % (ref 36.0–46.0)
HEMOGLOBIN: 11.6 g/dL — AB (ref 12.0–15.0)
Hemoglobin: 12.3 g/dL (ref 12.0–15.0)
MCH: 31.9 pg (ref 26.0–34.0)
MCH: 31.6 pg (ref 26.0–34.0)
MCHC: 33.8 g/dL (ref 30.0–36.0)
MCHC: 34 g/dL (ref 30.0–36.0)
MCV: 94.5 fL (ref 78.0–100.0)
MCV: 92.9 fL (ref 78.0–100.0)
PLATELETS: 144 10*3/uL — AB (ref 150–400)
Platelets: 134 10*3/uL — ABNORMAL LOW (ref 150–400)
RBC: 3.85 MIL/uL — ABNORMAL LOW (ref 3.87–5.11)
RBC: 3.67 MIL/uL — AB (ref 3.87–5.11)
RDW: 14.9 % (ref 11.5–15.5)
RDW: 14.9 % (ref 11.5–15.5)
WBC: 10.8 10*3/uL — AB (ref 4.0–10.5)
WBC: 14.1 10*3/uL — ABNORMAL HIGH (ref 4.0–10.5)

## 2017-12-06 LAB — ABO/RH: ABO/RH(D): B POS

## 2017-12-06 LAB — RPR: RPR Ser Ql: NONREACTIVE

## 2017-12-06 LAB — TYPE AND SCREEN
ABO/RH(D): B POS
ANTIBODY SCREEN: NEGATIVE

## 2017-12-06 SURGERY — Surgical Case
Anesthesia: Epidural

## 2017-12-06 MED ORDER — PHENYLEPHRINE HCL 10 MG/ML IJ SOLN
INTRAMUSCULAR | Status: DC | PRN
  Administered 2017-12-06 (×8): 80 ug via INTRAVENOUS
  Administered 2017-12-06: 120 ug via INTRAVENOUS
  Administered 2017-12-06 (×2): 80 ug via INTRAVENOUS

## 2017-12-06 MED ORDER — LACTATED RINGERS IV SOLN
INTRAVENOUS | Status: DC
  Administered 2017-12-06 (×5): via INTRAVENOUS

## 2017-12-06 MED ORDER — DEXAMETHASONE SODIUM PHOSPHATE 10 MG/ML IJ SOLN
INTRAMUSCULAR | Status: AC
  Filled 2017-12-06: qty 1

## 2017-12-06 MED ORDER — SOD CITRATE-CITRIC ACID 500-334 MG/5ML PO SOLN: 30.0000 mL | ORAL | Status: DC

## 2017-12-06 MED ORDER — SIMETHICONE 80 MG PO CHEW
80.0000 mg | CHEWABLE_TABLET | ORAL | Status: DC
  Filled 2017-12-06: qty 1

## 2017-12-06 MED ORDER — ONDANSETRON HCL 4 MG/2ML IJ SOLN
INTRAMUSCULAR | Status: DC | PRN
  Administered 2017-12-06: 4 mg via INTRAVENOUS

## 2017-12-06 MED ORDER — OXYCODONE-ACETAMINOPHEN 5-325 MG PO TABS: 2.0000 | ORAL_TABLET | ORAL | Status: DC | PRN

## 2017-12-06 MED ORDER — OXYCODONE HCL 5 MG PO TABS
ORAL_TABLET | ORAL | Status: AC
  Filled 2017-12-06: qty 1

## 2017-12-06 MED ORDER — MISOPROSTOL 25 MCG QUARTER TABLET
25.0000 ug | ORAL_TABLET | ORAL | Status: DC | PRN
  Administered 2017-12-06 (×2): 25 ug via VAGINAL
  Filled 2017-12-06 (×2): qty 1

## 2017-12-06 MED ORDER — GENTAMICIN SULFATE 40 MG/ML IJ SOLN
INTRAVENOUS | Status: AC
  Administered 2017-12-06: 116 mL via INTRAVENOUS
  Filled 2017-12-06: qty 10

## 2017-12-06 MED ORDER — FENTANYL 2.5 MCG/ML BUPIVACAINE 1/10 % EPIDURAL INFUSION (WH - ANES): 14.0000 mL/h | INTRAMUSCULAR | Status: DC | PRN

## 2017-12-06 MED ORDER — DIPHENHYDRAMINE HCL 25 MG PO CAPS
25.0000 mg | ORAL_CAPSULE | ORAL | Status: DC | PRN
  Administered 2017-12-07: 25 mg via ORAL
  Filled 2017-12-06: qty 1

## 2017-12-06 MED ORDER — MENTHOL 3 MG MT LOZG: 1.0000 | LOZENGE | OROMUCOSAL | Status: DC | PRN

## 2017-12-06 MED ORDER — NALBUPHINE HCL 10 MG/ML IJ SOLN
5.0000 mg | INTRAMUSCULAR | Status: DC | PRN
  Administered 2017-12-07: 5 mg via INTRAVENOUS
  Filled 2017-12-06: qty 1

## 2017-12-06 MED ORDER — HYDROMORPHONE HCL 1 MG/ML IJ SOLN: 0.2500 mg | INTRAMUSCULAR | Status: DC | PRN

## 2017-12-06 MED ORDER — DIPHENHYDRAMINE HCL 25 MG PO CAPS: 25.0000 mg | ORAL_CAPSULE | Freq: Four times a day (QID) | ORAL | Status: DC | PRN

## 2017-12-06 MED ORDER — ONDANSETRON HCL 4 MG/2ML IJ SOLN: 4.0000 mg | Freq: Three times a day (TID) | INTRAMUSCULAR | Status: DC | PRN

## 2017-12-06 MED ORDER — OXYTOCIN 40 UNITS IN LACTATED RINGERS INFUSION - SIMPLE MED: 2.5000 [IU]/h | INTRAVENOUS | Status: AC

## 2017-12-06 MED ORDER — PHENYLEPHRINE 40 MCG/ML (10ML) SYRINGE FOR IV PUSH (FOR BLOOD PRESSURE SUPPORT)
80.0000 ug | PREFILLED_SYRINGE | INTRAVENOUS | Status: DC | PRN
  Filled 2017-12-06: qty 10

## 2017-12-06 MED ORDER — PHENYLEPHRINE 40 MCG/ML (10ML) SYRINGE FOR IV PUSH (FOR BLOOD PRESSURE SUPPORT): 80.0000 ug | PREFILLED_SYRINGE | INTRAVENOUS | Status: DC | PRN

## 2017-12-06 MED ORDER — NALBUPHINE HCL 10 MG/ML IJ SOLN: 5.0000 mg | Freq: Once | INTRAMUSCULAR | Status: DC | PRN

## 2017-12-06 MED ORDER — WITCH HAZEL-GLYCERIN EX PADS: 1.0000 "application " | MEDICATED_PAD | CUTANEOUS | Status: DC | PRN

## 2017-12-06 MED ORDER — PHENYLEPHRINE 40 MCG/ML (10ML) SYRINGE FOR IV PUSH (FOR BLOOD PRESSURE SUPPORT)
PREFILLED_SYRINGE | INTRAVENOUS | Status: AC
  Filled 2017-12-06: qty 30

## 2017-12-06 MED ORDER — MORPHINE SULFATE (PF) 0.5 MG/ML IJ SOLN
INTRAMUSCULAR | Status: DC | PRN
  Administered 2017-12-06: 4 mg via EPIDURAL

## 2017-12-06 MED ORDER — KETOROLAC TROMETHAMINE 30 MG/ML IJ SOLN
INTRAMUSCULAR | Status: AC
  Administered 2017-12-06: 30 mg via INTRAMUSCULAR
  Filled 2017-12-06: qty 1

## 2017-12-06 MED ORDER — CLINDAMYCIN PHOSPHATE 900 MG/50ML IV SOLN: 900.0000 mg | INTRAVENOUS | Status: DC

## 2017-12-06 MED ORDER — OXYTOCIN 10 UNIT/ML IJ SOLN
INTRAVENOUS | Status: DC | PRN
  Administered 2017-12-06: 40 [IU] via INTRAVENOUS

## 2017-12-06 MED ORDER — FENTANYL CITRATE (PF) 100 MCG/2ML IJ SOLN
100.0000 ug | INTRAMUSCULAR | Status: DC | PRN
  Administered 2017-12-06: 100 ug via INTRAVENOUS
  Filled 2017-12-06: qty 2

## 2017-12-06 MED ORDER — FLEET ENEMA 7-19 GM/118ML RE ENEM: 1.0000 | ENEMA | RECTAL | Status: DC | PRN

## 2017-12-06 MED ORDER — OXYCODONE HCL 5 MG PO TABS: 5.0000 mg | ORAL_TABLET | ORAL | Status: DC | PRN

## 2017-12-06 MED ORDER — ACETAMINOPHEN 325 MG PO TABS
650.0000 mg | ORAL_TABLET | ORAL | Status: DC | PRN
  Administered 2017-12-07 (×3): 650 mg via ORAL
  Filled 2017-12-06 (×3): qty 2

## 2017-12-06 MED ORDER — SODIUM CHLORIDE 0.9% FLUSH: 3.0000 mL | INTRAVENOUS | Status: DC | PRN

## 2017-12-06 MED ORDER — LACTATED RINGERS IV SOLN
INTRAVENOUS | Status: DC
  Administered 2017-12-07: 07:00:00 via INTRAVENOUS

## 2017-12-06 MED ORDER — LIDOCAINE-EPINEPHRINE 2 %-1:100000 IJ SOLN
INTRAMUSCULAR | Status: DC | PRN
  Administered 2017-12-06: 3 mL via INTRADERMAL
  Administered 2017-12-06: 2 mL via INTRADERMAL
  Administered 2017-12-06: 5 mL via INTRADERMAL

## 2017-12-06 MED ORDER — LACTATED RINGERS IV SOLN: 500.0000 mL | INTRAVENOUS | Status: DC | PRN

## 2017-12-06 MED ORDER — IBUPROFEN 600 MG PO TABS
600.0000 mg | ORAL_TABLET | Freq: Four times a day (QID) | ORAL | Status: DC
  Administered 2017-12-07 – 2017-12-08 (×3): 600 mg via ORAL
  Filled 2017-12-06 (×5): qty 1

## 2017-12-06 MED ORDER — COCONUT OIL OIL
1.0000 "application " | TOPICAL_OIL | Status: DC | PRN
  Filled 2017-12-06: qty 120

## 2017-12-06 MED ORDER — DEXAMETHASONE SODIUM PHOSPHATE 10 MG/ML IJ SOLN
INTRAMUSCULAR | Status: DC | PRN
  Administered 2017-12-06: 10 mg via INTRAVENOUS

## 2017-12-06 MED ORDER — MORPHINE SULFATE (PF) 0.5 MG/ML IJ SOLN
INTRAMUSCULAR | Status: AC
  Filled 2017-12-06: qty 10

## 2017-12-06 MED ORDER — SIMETHICONE 80 MG PO CHEW
80.0000 mg | CHEWABLE_TABLET | Freq: Three times a day (TID) | ORAL | Status: DC
  Administered 2017-12-07 – 2017-12-08 (×5): 80 mg via ORAL
  Filled 2017-12-06 (×5): qty 1

## 2017-12-06 MED ORDER — LIDOCAINE HCL (PF) 1 % IJ SOLN: 30.0000 mL | INTRAMUSCULAR | Status: DC | PRN

## 2017-12-06 MED ORDER — ACETAMINOPHEN 325 MG PO TABS: 650.0000 mg | ORAL_TABLET | ORAL | Status: DC | PRN

## 2017-12-06 MED ORDER — NALOXONE HCL 4 MG/10ML IJ SOLN
1.0000 ug/kg/h | INTRAVENOUS | Status: DC | PRN
  Filled 2017-12-06: qty 5

## 2017-12-06 MED ORDER — DIBUCAINE 1 % RE OINT: 1.0000 "application " | TOPICAL_OINTMENT | RECTAL | Status: DC | PRN

## 2017-12-06 MED ORDER — OXYCODONE-ACETAMINOPHEN 5-325 MG PO TABS: 1.0000 | ORAL_TABLET | ORAL | Status: DC | PRN

## 2017-12-06 MED ORDER — TERBUTALINE SULFATE 1 MG/ML IJ SOLN: 0.2500 mg | Freq: Once | INTRAMUSCULAR | Status: DC | PRN

## 2017-12-06 MED ORDER — PROMETHAZINE HCL 25 MG/ML IJ SOLN: 6.2500 mg | INTRAMUSCULAR | Status: DC | PRN

## 2017-12-06 MED ORDER — SCOPOLAMINE 1 MG/3DAYS TD PT72
1.0000 | MEDICATED_PATCH | Freq: Once | TRANSDERMAL | Status: DC
  Filled 2017-12-06: qty 1

## 2017-12-06 MED ORDER — OXYCODONE HCL 5 MG PO TABS
5.0000 mg | ORAL_TABLET | Freq: Once | ORAL | Status: AC | PRN
  Administered 2017-12-06: 5 mg via ORAL

## 2017-12-06 MED ORDER — GENTAMICIN SULFATE 40 MG/ML IJ SOLN: 5.0000 mg/kg | INTRAVENOUS | Status: DC

## 2017-12-06 MED ORDER — LIDOCAINE HCL (PF) 1 % IJ SOLN
INTRAMUSCULAR | Status: DC | PRN
  Administered 2017-12-06 (×2): 4 mL

## 2017-12-06 MED ORDER — EPHEDRINE 5 MG/ML INJ: 10.0000 mg | INTRAVENOUS | Status: DC | PRN

## 2017-12-06 MED ORDER — SOD CITRATE-CITRIC ACID 500-334 MG/5ML PO SOLN
30.0000 mL | ORAL | Status: DC | PRN
  Administered 2017-12-06: 30 mL via ORAL
  Filled 2017-12-06 (×2): qty 15

## 2017-12-06 MED ORDER — OXYTOCIN 10 UNIT/ML IJ SOLN
INTRAMUSCULAR | Status: AC
  Filled 2017-12-06: qty 4

## 2017-12-06 MED ORDER — SENNOSIDES-DOCUSATE SODIUM 8.6-50 MG PO TABS
2.0000 | ORAL_TABLET | ORAL | Status: DC
  Administered 2017-12-08: 2 via ORAL
  Filled 2017-12-06: qty 2

## 2017-12-06 MED ORDER — OXYTOCIN 40 UNITS IN LACTATED RINGERS INFUSION - SIMPLE MED
1.0000 m[IU]/min | INTRAVENOUS | Status: DC
  Administered 2017-12-06 (×3): 2 m[IU]/min via INTRAVENOUS
  Filled 2017-12-06: qty 1000

## 2017-12-06 MED ORDER — LACTATED RINGERS IV SOLN
INTRAVENOUS | Status: DC | PRN
  Administered 2017-12-06 (×3): via INTRAVENOUS

## 2017-12-06 MED ORDER — FENTANYL 2.5 MCG/ML BUPIVACAINE 1/10 % EPIDURAL INFUSION (WH - ANES)
14.0000 mL/h | INTRAMUSCULAR | Status: DC | PRN
  Administered 2017-12-06: 14 mL/h via EPIDURAL
  Filled 2017-12-06: qty 100

## 2017-12-06 MED ORDER — TETANUS-DIPHTH-ACELL PERTUSSIS 5-2.5-18.5 LF-MCG/0.5 IM SUSP: 0.5000 mL | Freq: Once | INTRAMUSCULAR | Status: DC

## 2017-12-06 MED ORDER — DIPHENHYDRAMINE HCL 50 MG/ML IJ SOLN: 12.5000 mg | INTRAMUSCULAR | Status: DC | PRN

## 2017-12-06 MED ORDER — NALOXONE HCL 0.4 MG/ML IJ SOLN: 0.4000 mg | INTRAMUSCULAR | Status: DC | PRN

## 2017-12-06 MED ORDER — OXYCODONE HCL 5 MG PO TABS: 10.0000 mg | ORAL_TABLET | ORAL | Status: DC | PRN

## 2017-12-06 MED ORDER — ONDANSETRON HCL 4 MG/2ML IJ SOLN: 4.0000 mg | Freq: Four times a day (QID) | INTRAMUSCULAR | Status: DC | PRN

## 2017-12-06 MED ORDER — KETOROLAC TROMETHAMINE 30 MG/ML IJ SOLN
30.0000 mg | Freq: Once | INTRAMUSCULAR | Status: AC
  Administered 2017-12-06: 30 mg via INTRAMUSCULAR

## 2017-12-06 MED ORDER — ZOLPIDEM TARTRATE 5 MG PO TABS: 5.0000 mg | ORAL_TABLET | Freq: Every evening | ORAL | Status: DC | PRN

## 2017-12-06 MED ORDER — OXYTOCIN BOLUS FROM INFUSION: 500.0000 mL | Freq: Once | INTRAVENOUS | Status: DC

## 2017-12-06 MED ORDER — SCOPOLAMINE 1 MG/3DAYS TD PT72
MEDICATED_PATCH | TRANSDERMAL | Status: AC
  Filled 2017-12-06: qty 1

## 2017-12-06 MED ORDER — SIMETHICONE 80 MG PO CHEW
80.0000 mg | CHEWABLE_TABLET | ORAL | Status: DC | PRN
  Administered 2017-12-08: 80 mg via ORAL

## 2017-12-06 MED ORDER — NALBUPHINE HCL 10 MG/ML IJ SOLN: 5.0000 mg | INTRAMUSCULAR | Status: DC | PRN

## 2017-12-06 MED ORDER — OXYCODONE HCL 5 MG/5ML PO SOLN: 5.0000 mg | Freq: Once | ORAL | Status: AC | PRN

## 2017-12-06 MED ORDER — LACTATED RINGERS IV SOLN: 500.0000 mL | Freq: Once | INTRAVENOUS | Status: DC

## 2017-12-06 MED ORDER — PRENATAL MULTIVITAMIN CH
1.0000 | ORAL_TABLET | Freq: Every day | ORAL | Status: DC
  Administered 2017-12-07 – 2017-12-08 (×2): 1 via ORAL
  Filled 2017-12-06 (×2): qty 1

## 2017-12-06 MED ORDER — ONDANSETRON HCL 4 MG/2ML IJ SOLN
INTRAMUSCULAR | Status: AC
  Filled 2017-12-06: qty 2

## 2017-12-06 MED ORDER — OXYTOCIN 40 UNITS IN LACTATED RINGERS INFUSION - SIMPLE MED: 2.5000 [IU]/h | INTRAVENOUS | Status: DC

## 2017-12-06 SURGICAL SUPPLY — 39 items
BENZOIN TINCTURE PRP APPL 2/3 (GAUZE/BANDAGES/DRESSINGS) ×3 IMPLANT
CHLORAPREP W/TINT 26ML (MISCELLANEOUS) ×3 IMPLANT
CLAMP CORD UMBIL (MISCELLANEOUS) IMPLANT
CLOSURE WOUND 1/2 X4 (GAUZE/BANDAGES/DRESSINGS) ×1
CLOTH BEACON ORANGE TIMEOUT ST (SAFETY) ×3 IMPLANT
DERMABOND ADVANCED (GAUZE/BANDAGES/DRESSINGS) ×2
DERMABOND ADVANCED .7 DNX12 (GAUZE/BANDAGES/DRESSINGS) ×1 IMPLANT
DRSG OPSITE POSTOP 4X10 (GAUZE/BANDAGES/DRESSINGS) ×3 IMPLANT
ELECT REM PT RETURN 9FT ADLT (ELECTROSURGICAL) ×3
ELECTRODE REM PT RTRN 9FT ADLT (ELECTROSURGICAL) ×1 IMPLANT
EXTRACTOR VACUUM KIWI (MISCELLANEOUS) IMPLANT
GLOVE BIO SURGEON STRL SZ 6.5 (GLOVE) ×2 IMPLANT
GLOVE BIO SURGEONS STRL SZ 6.5 (GLOVE) ×1
GLOVE BIOGEL PI IND STRL 6.5 (GLOVE) ×1 IMPLANT
GLOVE BIOGEL PI IND STRL 7.0 (GLOVE) ×2 IMPLANT
GLOVE BIOGEL PI INDICATOR 6.5 (GLOVE) ×2
GLOVE BIOGEL PI INDICATOR 7.0 (GLOVE) ×4
GOWN STRL REUS W/TWL LRG LVL3 (GOWN DISPOSABLE) ×6 IMPLANT
HEMOSTAT ARISTA ABSORB 3G PWDR (MISCELLANEOUS) ×3 IMPLANT
KIT ABG SYR 3ML LUER SLIP (SYRINGE) ×3 IMPLANT
NEEDLE HYPO 25X5/8 SAFETYGLIDE (NEEDLE) ×3 IMPLANT
NS IRRIG 1000ML POUR BTL (IV SOLUTION) ×3 IMPLANT
PACK C SECTION WH (CUSTOM PROCEDURE TRAY) ×3 IMPLANT
PAD ABD 8X10 STRL (GAUZE/BANDAGES/DRESSINGS) ×3 IMPLANT
PAD OB MATERNITY 4.3X12.25 (PERSONAL CARE ITEMS) ×3 IMPLANT
PENCIL SMOKE EVAC W/HOLSTER (ELECTROSURGICAL) ×3 IMPLANT
RETRACTOR WND ALEXIS 25 LRG (MISCELLANEOUS) IMPLANT
RTRCTR WOUND ALEXIS 25CM LRG (MISCELLANEOUS)
STRIP CLOSURE SKIN 1/2X4 (GAUZE/BANDAGES/DRESSINGS) ×2 IMPLANT
SUT PLAIN 0 NONE (SUTURE) IMPLANT
SUT PLAIN 2 0 (SUTURE) ×2
SUT PLAIN ABS 2-0 CT1 27XMFL (SUTURE) ×1 IMPLANT
SUT VIC AB 0 CT1 36 (SUTURE) ×3 IMPLANT
SUT VIC AB 0 CTX 36 (SUTURE) ×4
SUT VIC AB 0 CTX36XBRD ANBCTRL (SUTURE) ×2 IMPLANT
SUT VIC AB 4-0 PS2 27 (SUTURE) ×3 IMPLANT
SWABSTK COMLB BENZOIN TINCTURE (MISCELLANEOUS) ×3 IMPLANT
TOWEL OR 17X24 6PK STRL BLUE (TOWEL DISPOSABLE) ×3 IMPLANT
TRAY FOLEY BAG SILVER LF 14FR (SET/KITS/TRAYS/PACK) IMPLANT

## 2017-12-06 NOTE — H&P (Signed)
Erica Lucas is a 37 y.o. female presenting for IOL. OB History    Gravida Para Term Preterm AB Living   1             SAB TAB Ectopic Multiple Live Births                 Past Medical History:  Diagnosis Date  . AMA (advanced maternal age) primigravida 67+   . Fibroid   . Medical history non-contributory   . UTI (urinary tract infection)    Past Surgical History:  Procedure Laterality Date  . NO PAST SURGERIES     Family History: family history includes Diabetes in her mother; Hypertension in her mother. Social History:  reports that  has never smoked. she has never used smokeless tobacco. She reports that she does not drink alcohol or use drugs.     Maternal Diabetes: No Genetic Screening: Normal Maternal Ultrasounds/Referrals: Normal except 11cm fundal fibroid Fetal Ultrasounds or other Referrals:  None Maternal Substance Abuse:  No Significant Maternal Medications:  None Significant Maternal Lab Results:  None Other Comments:  None  ROS History Dilation: 3 Effacement (%): 50 Station: -3 Exam by:: Dr. Royston Sinner Blood pressure 114/64, pulse 85, temperature 98.1 F (36.7 C), temperature source Oral, resp. rate 18, height 5\' 7"  (1.702 m), weight 79.9 kg (176 lb 3.2 oz), last menstrual period 02/25/2017. Exam Physical Exam  Prenatal labs: ABO, Rh: --/--/B POS, B POS (01/21 0107) Antibody: NEG (01/21 0107) Rubella: Immune (06/13 0000) RPR: Nonreactive (06/13 0000)  HBsAg: Negative (06/13 0000)  HIV: Non-reactive (06/13 0000)  GBS: Negative (12/18 0000)   Assessment/Plan: 37yo G1P0 @ 40.4wga presenting for IOL w/unfavorable cervix. S/p cytotec x 2, now 3/80/-2. Has a h/o 11cm fundal fibroid that has been overall stable this pregnancy. Plan for pitocin 4 hours after last cytotec. It's a boy - "Erica Lucas". GBS neg.     Tyson Dense 12/06/2017, 8:34 AM

## 2017-12-06 NOTE — Anesthesia Preprocedure Evaluation (Signed)
Anesthesia Evaluation  Patient identified by MRN, date of birth, ID band Patient awake    Reviewed: Allergy & Precautions, NPO status , Patient's Chart, lab work & pertinent test results  Airway Mallampati: II  TM Distance: >3 FB Neck ROM: Full    Dental no notable dental hx.    Pulmonary neg pulmonary ROS,    Pulmonary exam normal breath sounds clear to auscultation       Cardiovascular negative cardio ROS Normal cardiovascular exam Rhythm:Regular Rate:Normal     Neuro/Psych negative neurological ROS  negative psych ROS   GI/Hepatic negative GI ROS, Neg liver ROS,   Endo/Other  negative endocrine ROS  Renal/GU negative Renal ROS     Musculoskeletal negative musculoskeletal ROS (+)   Abdominal   Peds  Hematology negative hematology ROS (+)   Anesthesia Other Findings   Reproductive/Obstetrics (+) Pregnancy                             Anesthesia Physical Anesthesia Plan  ASA: II  Anesthesia Plan: Epidural   Post-op Pain Management:    Induction:   PONV Risk Score and Plan:   Airway Management Planned:   Additional Equipment:   Intra-op Plan:   Post-operative Plan:   Informed Consent: I have reviewed the patients History and Physical, chart, labs and discussed the procedure including the risks, benefits and alternatives for the proposed anesthesia with the patient or authorized representative who has indicated his/her understanding and acceptance.       Plan Discussed with:   Anesthesia Plan Comments:         Anesthesia Quick Evaluation  

## 2017-12-06 NOTE — Progress Notes (Signed)
5/70/-2 S/p AROM for clear fluid ~ 0800 FHT w/intermittent var and occ lates. Var remains moderate. Decelerations have improved with repositioning. IUPC placed at time of SVE s/s inability to assess what type of decelerations. Contraction pattern not quite adequate but pitocin unable to be restarted s/s decels and patient made change from 3 to 5cm. CTM closely. FHT overall reassuring.     Arty Baumgartner MD

## 2017-12-06 NOTE — Anesthesia Pain Management Evaluation Note (Signed)
  CRNA Pain Management Visit Note  Patient: Erica Lucas, 37 y.o., female  "Hello I am a member of the anesthesia team at [facility]. We have an anesthesia team available at all times to provide care throughout the hospital, including epidural management and anesthesia for C-section. I don't know your plan for the delivery whether it a natural birth, water birth, IV sedation, nitrous supplementation, doula or epidural, but we want to meet your pain goals."   1.Was your pain managed to your expectations on prior hospitalizations?   Yes   2.What is your expectation for pain management during this hospitalization?     IV pain meds  3.How can we help you reach that goal? IV MEDS  Record the patient's initial score and the patient's pain goal.   Pain: 6/10  Pain Goal: 2/10 [facility] wants you to be able to say your pain was always managed very well.  Erica Lucas 12/06/2017

## 2017-12-06 NOTE — Brief Op Note (Signed)
12/06/2017  8:19 PM  PATIENT:  Erica Lucas  37 y.o. female  PRE-OPERATIVE DIAGNOSIS:  Arrest of Dilation and Fetal Intolerence to Labor  POST-OPERATIVE DIAGNOSIS:  Arrest of Dilation and Fetal Intolerence to Labor  PROCEDURE:  Procedure(s): CESAREAN SECTION (N/A)  SURGEON:  Surgeon(s) and Role:    * Tyson Dense, MD - Primary  PHYSICIAN ASSISTANT:   ASSISTANTS: none   ANESTHESIA:   epidural  EBL:  1241 mL   BLOOD ADMINISTERED:none  DRAINS: Urinary Catheter (Foley)   LOCAL MEDICATIONS USED:  NONE  SPECIMEN:  Source of Specimen:  palcenta  DISPOSITION OF SPECIMEN:  L&D  COUNTS:  YES  TOURNIQUET:  * No tourniquets in log *  DICTATION: .Note written in EPIC  PLAN OF CARE: Admit to inpatient   PATIENT DISPOSITION:  PACU - hemodynamically stable.   Delay start of Pharmacological VTE agent (>24hrs) due to surgical blood loss or risk of bleeding: not applicable

## 2017-12-06 NOTE — Anesthesia Procedure Notes (Signed)
Epidural Patient location during procedure: OB  Staffing Anesthesiologist: Nolon Nations, MD Performed: anesthesiologist   Preanesthetic Checklist Completed: patient identified, pre-op evaluation, timeout performed, IV checked, risks and benefits discussed and monitors and equipment checked  Epidural Patient position: sitting Prep: site prepped and draped and DuraPrep Patient monitoring: heart rate, continuous pulse ox and blood pressure Approach: midline Location: L3-L4 Injection technique: LOR air and LOR saline  Needle:  Needle type: Tuohy  Needle gauge: 17 G Needle length: 9 cm Needle insertion depth: 5 cm Catheter type: closed end flexible Catheter size: 19 Gauge Catheter at skin depth: 10 cm Test dose: negative  Assessment Sensory level: T8 Events: blood not aspirated, injection not painful, no injection resistance, negative IV test and no paresthesia  Additional Notes Reason for block:procedure for pain

## 2017-12-06 NOTE — Op Note (Signed)
PROCEDURE DATE: 12/06/17  PREOPERATIVE DIAGNOSIS: Intrauterine pregnancy at 40.4 wga, Indication: arrest of dilatin in setting of nrFHT  POSTOPERATIVE DIAGNOSIS:The same  PROCEDURE: Primary Low TransverseCesarean Section  SURGEON: Dr. Lucillie Garfinkel  INDICATIONS:This is a 37 yo G1 at 75.4 wga requiring cesarean section secondary to arrest of dilation. She progressed nicely through labor and then stalled at 7cm x 5 hours. Pitocin was unable to be titrated continuously s/s recurrent decelerations.  Decision made to proceed with pLTCS.The risks of cesarean section discussed with the patient included but were not limited to: bleeding which may require transfusion or reoperation; infection which may require antibiotics; injury to bowel, bladder, ureters or other surrounding organs; injury to the fetus; need for additional procedures including hysterectomy in the event of a life-threatening hemorrhage; placental abnormalities wth subsequent pregnancies, incisional problems, thromboembolic phenomenon and other postoperative/anesthesia complications. The patient agreed with the proposed plan, giving informed consent for the procedure.   FINDINGS: Viable maleinfant in vertex presentation with tight nuchal cord,APGARs pending, Weight pending, Amniotic fluid clear, Intact placenta, three vessel cord. Grossly normal uterus and fallopian tubes .  ANESTHESIA: Epidural ESTIMATED BLOOD LOSS: 1421cc SPECIMENS: Placenta for L&D COMPLICATIONS: None immediate   PROCEDURE IN DETAIL: The patient received intravenous antibiotics and had sequential compression devices applied to her lower extremities while in the preoperative area. [place] taken to the operating roomwhere epidural anesthesiawas dosed up to surgical level andwas found to be adequate. She was then placed in a dorsal supine position with a leftward tilt,and prepped and draped in a sterile manner.A foley catheter was  placed into her bladder and attached to constant gravity. After an adequate timeout was performed, aPfannenstiel skin incision was made with scalpel and carried through to the underlying layer of fascia. The fascia was incised in the midline and this incision was extended bilaterally using the Mayo scissors. Kocher clamps were applied to the superior aspect of the fascial incision and the underlying rectus muscles were dissected off bluntly. A similar process was carried out on the inferior aspect of the facial incision. The rectus muscles were separated in the midline bluntly and the peritoneum was entered bluntly. A bladder flap was created sharply and developed bluntly.Atransverse hysterotomy was made with a scalpel and extended bilaterally bluntly. The bladder blade was then removed. The infant was successfully delivered, and cord was clamped and cut and infant was handed over to awaiting neonatology team. Uterine massage was then administered and the placenta delivered intact with three-vessel cord. Cord gases were taken. The uterus was cleared of clot and debris. Uterus unable to be exteriorized s/s large 11cm fundal fibroid. The hysterotomy was closed with 0 vicryl.A second imbricating suture of 0-vicryl was used to reinforce the incision and aid in hemostasis. Arista placed for minimal oozing at LLUS. Hemostasis was achieved.The fascia was closed with 0-Vicryl in a running fashion with good restoration of anatomy. The skin was closed with 4-0 Vicryl in a subcuticular fashion.  Final EBL was 1241cc (all surgical site and was hemostatic at end of procedure) without any further bleeding on exam.   Pt tolerated the procedure well. All sponge/lap/needle counts were correct X 2. Pt taken to recovery room in stable condition.  It's a boy - "Shanon Brow"!   Lucillie Garfinkel MD

## 2017-12-06 NOTE — Progress Notes (Addendum)
Cervix remains unchanged - 7cm x almost 5 hours now - have had to d/c pitocin s/s decelerations. However, still some adequate contractions intermittently. Given inability to titrate pitocin s/s late decels and arrest at 7cm x 5 hours, I recommended primary Cesarean section. D/w patient and husband risks of infection, bleeding, damage to the surrounding structures and the need for additional procedures. Patient gives consent. Proceed with C section. Gent/clinda on call to OR - cephalosporin allergy.    Arty Baumgartner MD

## 2017-12-06 NOTE — Transfer of Care (Signed)
Immediate Anesthesia Transfer of Care Note  Patient: Erica Lucas  Procedure(s) Performed: CESAREAN SECTION (N/A )  Patient Location: PACU  Anesthesia Type:Epidural  Level of Consciousness: awake, alert  and oriented  Airway & Oxygen Therapy: Patient Spontanous Breathing  Post-op Assessment: Report given to RN and Post -op Vital signs reviewed and stable  Post vital signs: Reviewed and stable  Last Vitals:  Vitals:   12/06/17 1831 12/06/17 1840  BP: (!) 141/79 125/71  Pulse: 97 (!) 102  Resp:    Temp:    SpO2:  100%    Last Pain:  Vitals:   12/06/17 1812  TempSrc: Oral  PainSc:       Patients Stated Pain Goal: 8 (18/84/16 6063)  Complications: No apparent anesthesia complications

## 2017-12-06 NOTE — Consult Note (Signed)
Neonatology Note:   Attendance at C-section:    I was asked by Dr. Royston Sinner to attend this primary C/S at term due to failure to progress and fetal intolerance of labor. The mother is a G1P0 B pos, GBS negative with AMA and history of fibroids. ROM 12 hours prior to delivery, fluid clear. CAN times 1 loosely. Infant vigorous with good spontaneous cry and tone. Delayed cord clamping was done. Needed no suctioning. Ap 9/10. Lungs clear to ausc in DR. To CN to care of Pediatrician.   Real Cons, MD

## 2017-12-06 NOTE — Progress Notes (Signed)
Has been 7cm x 2.5 hours - cervix unchanged. Entire rim now swollen, vertex ROP and remains -2 station, manual attempt to OA unsuccessful. FHt mostly cat 2 but periods of cat 1. Responsive to scalp stim. IUPC with intermittent adequacy. Recommend re-SVE in 2 hours to assess for change.    Arty Baumgartner MD

## 2017-12-07 ENCOUNTER — Encounter (HOSPITAL_COMMUNITY): Payer: Self-pay | Admitting: Obstetrics and Gynecology

## 2017-12-07 LAB — CBC
HCT: 31.6 % — ABNORMAL LOW (ref 36.0–46.0)
Hemoglobin: 10.9 g/dL — ABNORMAL LOW (ref 12.0–15.0)
MCH: 31.9 pg (ref 26.0–34.0)
MCHC: 34.5 g/dL (ref 30.0–36.0)
MCV: 92.4 fL (ref 78.0–100.0)
PLATELETS: 143 10*3/uL — AB (ref 150–400)
RBC: 3.42 MIL/uL — ABNORMAL LOW (ref 3.87–5.11)
RDW: 14.9 % (ref 11.5–15.5)
WBC: 14.1 10*3/uL — AB (ref 4.0–10.5)

## 2017-12-07 NOTE — Lactation Note (Signed)
This note was copied from a baby's chart. Lactation Consultation Note  Patient Name: Erica Lucas [id number] Date: 12/07/2017 Reason for consult: Initial assessment;Difficult latch;Nipple pain/trauma   Initial consult with mom of 74 hour old infant. Infant with 2 BF for 10 minutes, att x 1, formula x 1 of 11 cc, 2 voids and 4 stools since birth. Infant weight 8 lb 7.5 oz with weight loss of 1%. LATCH scores 4-6.   Mom reports BF has been difficult with latching. RN started # 20 NS earlier today. Infant was waking to feed. Assisted mom with latching infant to the right breast in the football hold. Attempted to latch infant without the NS, mom was in a lot of pain. Applied # 20 NS and latched infant. Infant was pinching mom. Nipple was compressed post BF. Resized to # 24 NS and performed chin tug. Mom felt much more comfortable with this feeding. Nipple was round when infant was taken off to be taken for circumcision.   Mom with large compressible breasts and semi compressible areola, nipples are short shaft and flatten with areolar compression. Mom is aware of how to hand express. Mom has 1 inch surgical scar to upper right breast, discussed possibility of interrupted ducts that may not allow milk to flow in that area and that area may dry up if ducts are interrupted. Enc mom to wear breast shells between feedings to assist with eversion of nipple, shells are in the room, enc mom to put on bra and place breast shells. Advised not to wear when laying on her sides. Mom has manual pump to use to evert nipples. Advised mom to use # 24 NS with the feedings.   Obtained mom's Employee pump. Mom will have DEBP set up later today due to NS use. Maryellen Pile, RN and Malena Edman, [facility] aware pump needs to be set up.   BF Resources handout and [employer] given, mom informed of IP/OP Services, BF Support Groups and [place] phone #. Mom to call out for feeding assistance as needed.   Infant was take to nursery for  circumcision. Mom with no further questions at this time.    Maternal Data Formula Feeding for Exclusion: No Has patient been taught Hand Expression?: Yes Does the patient have breastfeeding experience prior to this delivery?: No  Feeding Feeding Type: Breast Fed Length of feed: 10 min  LATCH Score Latch: Repeated attempts needed to sustain latch, nipple held in mouth throughout feeding, stimulation needed to elicit sucking reflex.  Audible Swallowing: A few with stimulation  Type of Nipple: Flat  Comfort (Breast/Nipple): Filling, red/small blisters or bruises, mild/mod discomfort  Hold (Positioning): Assistance needed to correctly position infant at breast and maintain latch.  LATCH Score: 5  Interventions Interventions: Breast feeding basics reviewed;Adjust position;Assisted with latch;Support pillows;Skin to skin;Position options;Breast massage;Expressed milk;Breast compression;Hand pump;Shells;Hand express  Lactation Tools Discussed/Used Tools: Nipple Shields Nipple shield size: 24;20 Shell Type: Inverted Breast pump type: Manual WIC Program: No   Consult Status Consult Status: Follow-up Date: 12/08/17 Follow-up type: In-patient    Erica Lucas 12/07/2017, 1:05 PM

## 2017-12-07 NOTE — Addendum Note (Signed)
Addendum  created 12/07/17 0751 by Hewitt Blade, CRNA   Charge Capture section accepted, Sign clinical note

## 2017-12-07 NOTE — Progress Notes (Signed)
Patient doing well. BP (!) 120/51 (BP Location: Right Arm)   Pulse (!) 107   Temp 98.2 F (36.8 C) (Oral)   Resp 18   Ht 5\' 7"  (1.702 m)   Wt 79.9 kg (176 lb 3.2 oz)   LMP 02/25/2017   SpO2 97%   BMI 27.60 kg/m  Results for orders placed or performed during the hospital encounter of 12/06/17 (from the past 24 hour(s))  CBC     Status: Abnormal   Collection Time: 12/06/17 10:23 PM  Result Value Ref Range   WBC 14.1 (H) 4.0 - 10.5 K/uL   RBC 3.67 (L) 3.87 - 5.11 MIL/uL   Hemoglobin 11.6 (L) 12.0 - 15.0 g/dL   HCT 34.1 (L) 36.0 - 46.0 %   MCV 92.9 78.0 - 100.0 fL   MCH 31.6 26.0 - 34.0 pg   MCHC 34.0 30.0 - 36.0 g/dL   RDW 14.9 11.5 - 15.5 %   Platelets 134 (L) 150 - 400 K/uL  CBC     Status: Abnormal   Collection Time: 12/07/17  5:41 AM  Result Value Ref Range   WBC 14.1 (H) 4.0 - 10.5 K/uL   RBC 3.42 (L) 3.87 - 5.11 MIL/uL   Hemoglobin 10.9 (L) 12.0 - 15.0 g/dL   HCT 31.6 (L) 36.0 - 46.0 %   MCV 92.4 78.0 - 100.0 fL   MCH 31.9 26.0 - 34.0 pg   MCHC 34.5 30.0 - 36.0 g/dL   RDW 14.9 11.5 - 15.5 %   Platelets 143 (L) 150 - 400 K/uL   Abdomen is soft and non tender Bandage clean and dry Routine care POD # 1  circ today

## 2017-12-07 NOTE — Anesthesia Postprocedure Evaluation (Signed)
Anesthesia Post Note  Patient: Erica Lucas  Procedure(s) Performed: CESAREAN SECTION (N/A )     Patient location during evaluation: PACU Anesthesia Type: Epidural Level of consciousness: awake and alert Pain management: pain level controlled Vital Signs Assessment: post-procedure vital signs reviewed and stable Respiratory status: spontaneous breathing, nonlabored ventilation and respiratory function stable Cardiovascular status: stable Postop Assessment: no headache, no backache and epidural receding Anesthetic complications: no    Last Vitals:  Vitals:   12/07/17 0423 12/07/17 0608  BP:    Pulse:    Resp:    Temp:  36.8 C  SpO2: 98% 97%    Last Pain:  Vitals:   12/07/17 0608  TempSrc: Oral  PainSc:    Pain Goal: Patients Stated Pain Goal: 8 (12/06/17 0734)               Karyl Kinnier Ellender

## 2017-12-07 NOTE — Anesthesia Postprocedure Evaluation (Signed)
Anesthesia Post Note  Patient: Erica Lucas  Procedure(s) Performed: CESAREAN SECTION (N/A )     Patient location during evaluation: Mother Baby Anesthesia Type: Epidural Level of consciousness: awake and alert Pain management: pain level controlled Vital Signs Assessment: post-procedure vital signs reviewed and stable Respiratory status: spontaneous breathing and nonlabored ventilation Cardiovascular status: stable Postop Assessment: no headache, no backache, epidural receding, no apparent nausea or vomiting, patient able to bend at knees and adequate PO intake Anesthetic complications: no    Last Vitals:  Vitals:   12/07/17 0423 12/07/17 0608  BP:    Pulse:    Resp:    Temp:  36.8 C  SpO2: 98% 97%    Last Pain:  Vitals:   12/07/17 0608  TempSrc: Oral  PainSc:    Pain Goal: Patients Stated Pain Goal: 8 (12/06/17 0734)               Jabier Mutton

## 2017-12-08 LAB — BIRTH TISSUE RECOVERY COLLECTION (PLACENTA DONATION)

## 2017-12-08 MED ORDER — IBUPROFEN 600 MG PO TABS: 600.0000 mg | ORAL_TABLET | Freq: Four times a day (QID) | ORAL | 0 refills | Status: DC

## 2017-12-08 NOTE — Lactation Note (Signed)
This note was copied from a baby's chart. Lactation Consultation Note  Patient Name: Boy Irine Heminger [id number] Date: 12/08/2017 Reason for consult: Follow-up assessment;Difficult latch;Infant weight loss(5% weight loss )  Baby is 39 hours old  As  LC entered the room mom holding the baby in cradle position , trying to latch the baby to her inverted  Nipple.  [place] asked mom is she had been using the NS and SNS and she responded once during the night, but the  NS doesn't not stay on well. The baby doesn't stay on longer than 10 mins.  LC assessed breast tissue with moms permission and noted the left nipple to be inverted with semi compressible areola and the right areola to be less compressible and swollen.  @ this consult baby due to feed and showing feeding cues, LC inserted the 3F SNS through the top of the NS,  And reviewed with mom application. Baby latched , was able to sustain latch, intermittent air leak noted.. LC adjusted  Baby's hips and used the football position. Baby fed for 10 mins, and took 7 ml. When the baby released noted the rest of the formula had leaked under the NS.  LC finished the feeding showing mom and dad how to PACE feed with a bottle and the baby took 8 ml more of the formula. During the PACE feeding , noted an intermittent rubbing noise with the baby's tongue.  Baby has a significant recessed chin, high palate , and LC suspects a short posterior frenulum ( causing the rubbing noise ). ( LC did not discuss these findings with parents except recessed chin>  LC recommended to mom due to her challenging tissue  For latching, and the borderline fit for the Nipple Shield.  Consistent wearing of shells, and use of the DEBP every 2-3 hours both breast for 15 -20 mins is indicated  Around the clock to establish and protect milk supply.  LC explained to mom and dad this will take time and consistency .  Mom has a DEBP ( [place] ) and was shown yesterday by a [place] how to use it.  LC  recommended to mom she could latch at the breast with SNS with assistance otherwise to apply the NS and instill EBM or formula for an appetizer, and let the baby feed 15 - 20 mins , supplement after wards with a bottle.  If the baby is really fussy to start, give appetizer 10 -15 ml and then latch.   Sore nipple and engorgement prevention and tx reviewed.   Mom receptive to coming back for [facility] O/P appt. And LC requested in the Epic basket for the [facility] clinic to call mom.   Mother informed of post-discharge support and given phone number to the lactation department, including services for phone call assistance; out-patient appointments; and breastfeeding support group. List of other breastfeeding resources in the community given in the handout. Encouraged mother to call for problems or concerns related to breastfeeding.    Maternal Data Has patient been taught Hand Expression?: Yes(LC reviewed )  Feeding ( @ this feeding latched 1st with challenges and use of the NS and 3F SNS, and ended feeding with this a bottle to show PACE feeding - see above note.  Feeding Type: Bottle Fed - Formula Nipple Type: Slow - flow Length of feed: 10 min  LATCH Score Latch: Repeated attempts needed to sustain latch, nipple held in mouth throughout feeding, stimulation needed to elicit sucking reflex.  Audible  Swallowing: Spontaneous and intermittent  Type of Nipple: Flat  Comfort (Breast/Nipple): Filling, red/small blisters or bruises, mild/mod discomfort  Hold (Positioning): Assistance needed to correctly position infant at breast and maintain latch.  LATCH Score: 6  Interventions Interventions: Breast feeding basics reviewed  Lactation Tools Discussed/Used Tools: Shells;Pump(encouraged mom to wear shells ) Shell Type: Inverted Breast pump type: Double-Electric Breast Pump Pump Review: Setup, frequency, and cleaning(LC reviewed )   Consult Status Consult Status: Follow-up Date: ([place] placed a LC  O/P request in the Epic basket for the clinic to call mom ) Follow-up type: [place] 12/08/2017, 10:37 AM

## 2017-12-08 NOTE — Discharge Summary (Signed)
Obstetric Discharge Summary Reason for Admission: induction of labor Prenatal Procedures: none Intrapartum Procedures: cesarean: low cervical, transverse Postpartum Procedures: none Complications-Operative and Postpartum: none Hemoglobin  Date Value Ref Range Status  12/07/2017 10.9 (L) 12.0 - 15.0 g/dL Final   HCT  Date Value Ref Range Status  12/07/2017 31.6 (L) 36.0 - 46.0 % Final    Physical Exam:  General: alert Lochia: appropriate Uterine Fundus: firm Incision: healing well DVT Evaluation: No evidence of DVT seen on physical exam.  Discharge Diagnoses: Term Pregnancy-delivered, IOL, LTCS for FTP  Discharge Information: Date: 12/08/2017 Activity: pelvic rest Diet: routine Medications: PNV and Ibuprofen Condition: stable Instructions: refer to practice specific booklet Discharge to: home Follow-up [place] Schedule an appointment as soon as possible for a visit in 1 week(s).   Contact information: [address] [place] [address] [phone]           Newborn Data: Live born female  Birth Weight: 8 lb 9.4 oz (3895 g) APGAR: 55, 10  Newborn Delivery   Birth date/time:  12/06/2017 19:35:00 Delivery type:  C-Section, Low Transverse C-section categorization:  Primary     Home with mother.  [place] 12/08/2017, 7:53 AM

## 2017-12-11 ENCOUNTER — Telehealth (HOSPITAL_COMMUNITY): Payer: Self-pay | Admitting: Lactation Services

## 2017-12-11 NOTE — Telephone Encounter (Signed)
Spoke to mom. She called [place] back because she was trying to schedule an appt. Explained to her about follow up lactation appt; Mom is using a 24 mm NS and she's also pumping. She'll call to schedule and appt on Monday during business hours.

## 2018-01-05 DIAGNOSIS — M79643 Pain in unspecified hand: Secondary | ICD-10-CM | POA: Diagnosis not present

## 2018-01-05 DIAGNOSIS — R208 Other disturbances of skin sensation: Secondary | ICD-10-CM | POA: Diagnosis not present

## 2018-01-10 DIAGNOSIS — Z1389 Encounter for screening for other disorder: Secondary | ICD-10-CM | POA: Diagnosis not present

## 2018-02-14 DIAGNOSIS — D251 Intramural leiomyoma of uterus: Secondary | ICD-10-CM | POA: Diagnosis not present

## 2018-02-24 ENCOUNTER — Encounter (HOSPITAL_COMMUNITY): Payer: Self-pay

## 2018-08-13 IMAGING — US US OB COMP LESS 14 WK
1 series · 15 of 28 positions shown · non-contrast
Comparison: None.

CLINICAL DATA: Abdominal pain during first trimester pregnancy.

EXAM:
OBSTETRIC <14 WK ULTRASOUND
TECHNIQUE: Transabdominal ultrasound was performed for evaluation of the
gestation as well as the maternal uterus and adnexal regions.

[Series 1: us ob comp less 14 wk · 15 of 50 slices shown]
[im 1/50]
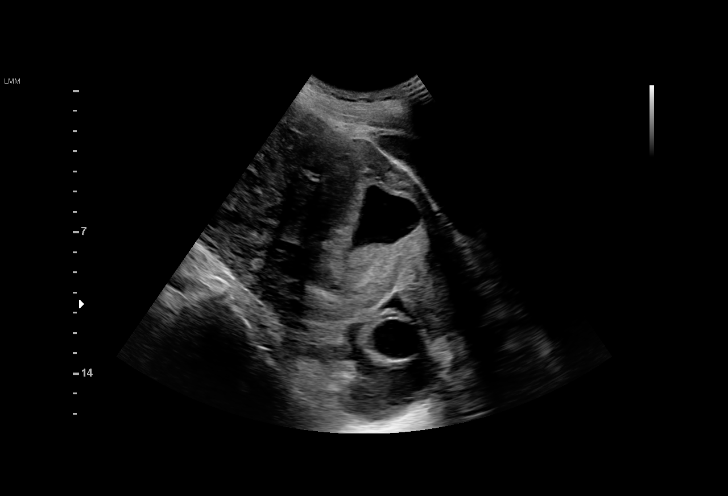
[im 4/50]
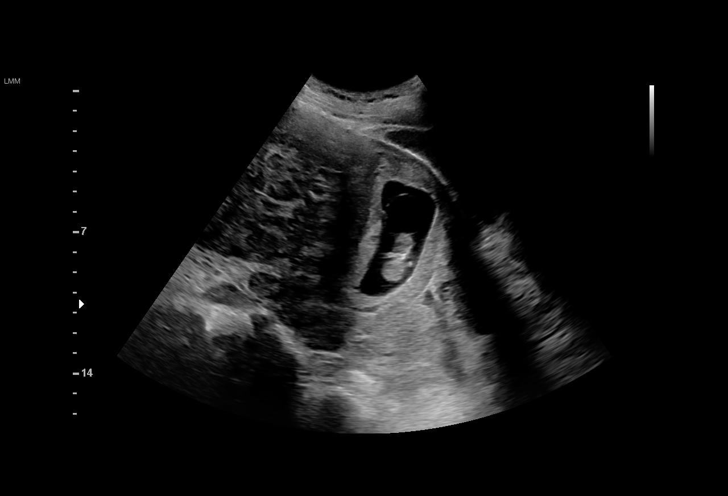
[im 8/50]
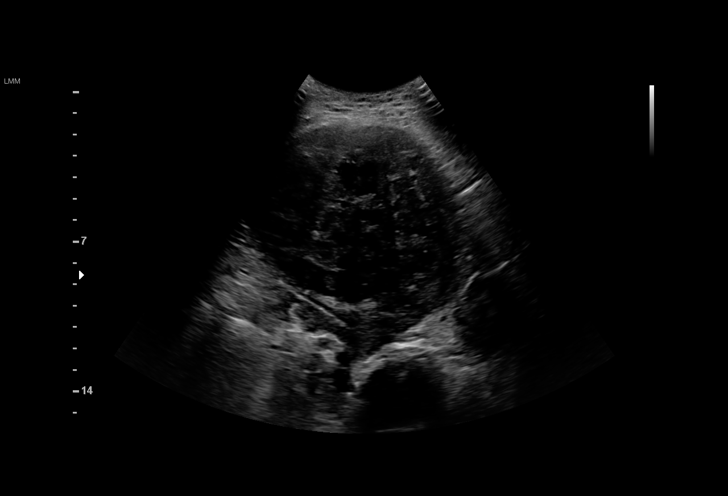
[im 11/50]
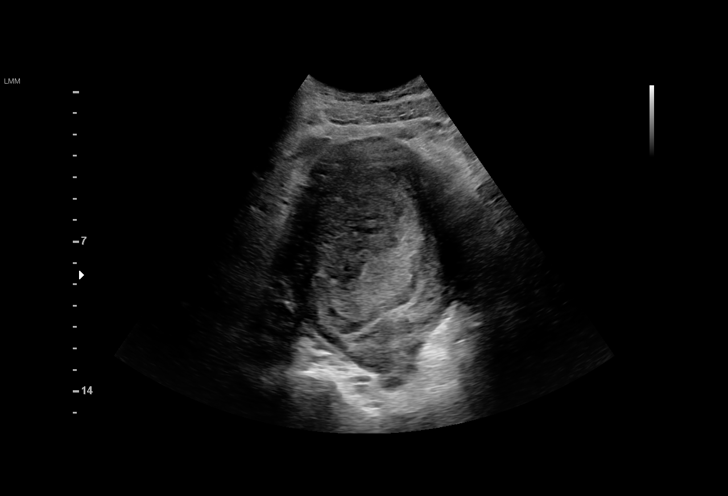
[im 15/50]
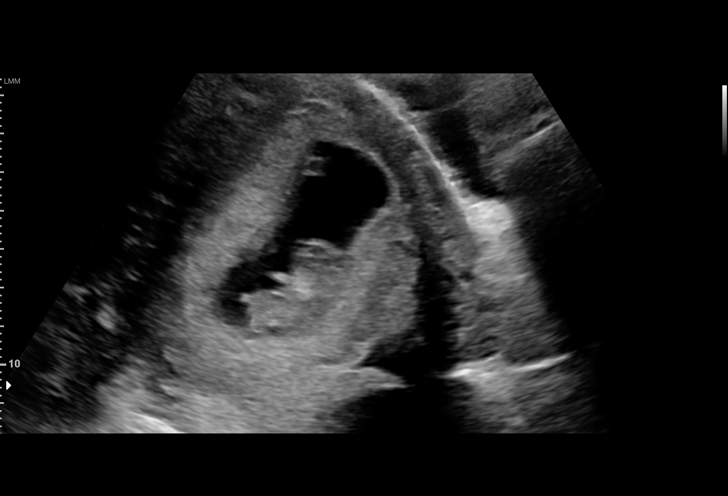
[im 19/50]
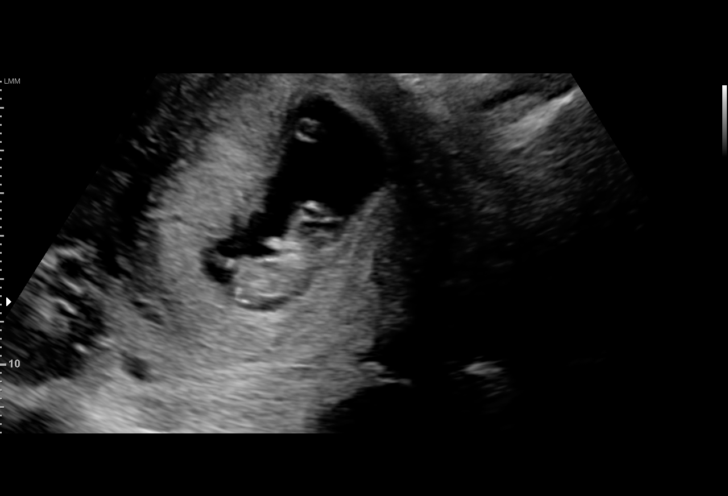
[im 22/50]
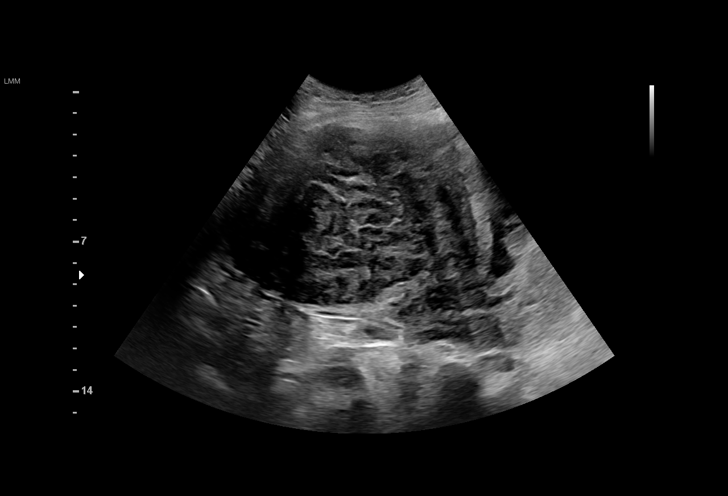
[im 26/50]
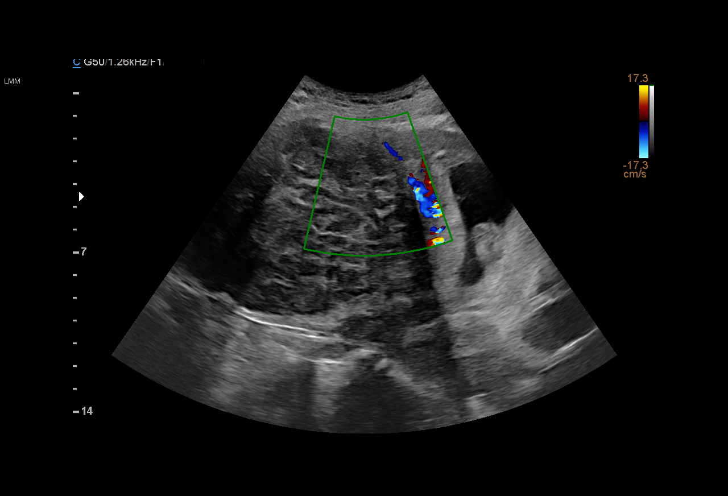
[im 28/50]
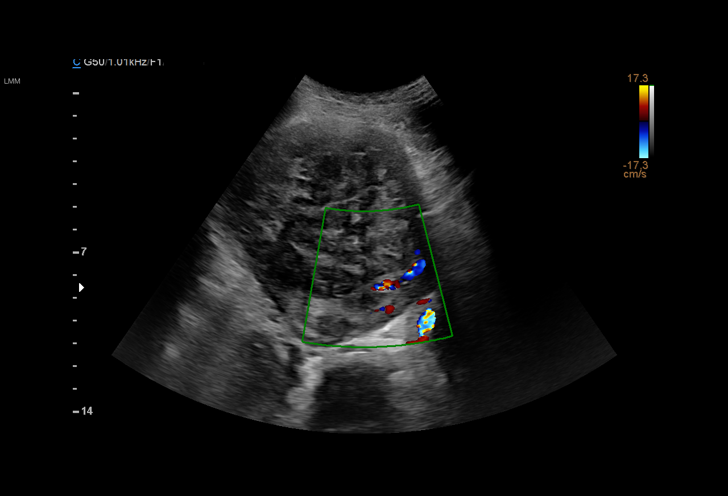
[im 31/50]
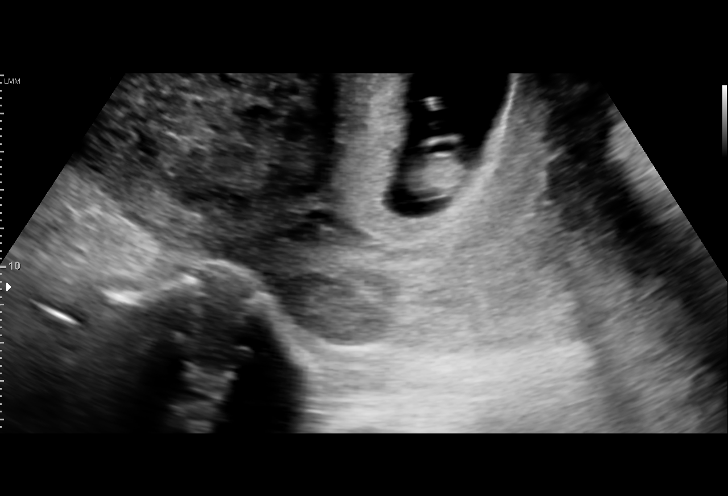
[im 35/50]
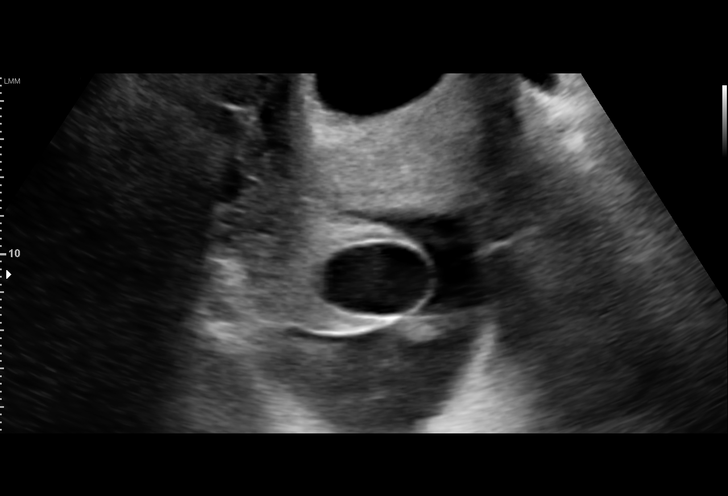
[im 39/50]
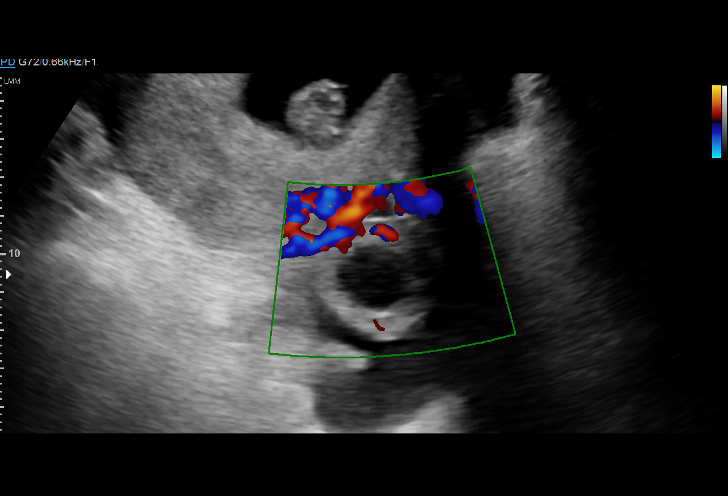
[im 42/50]
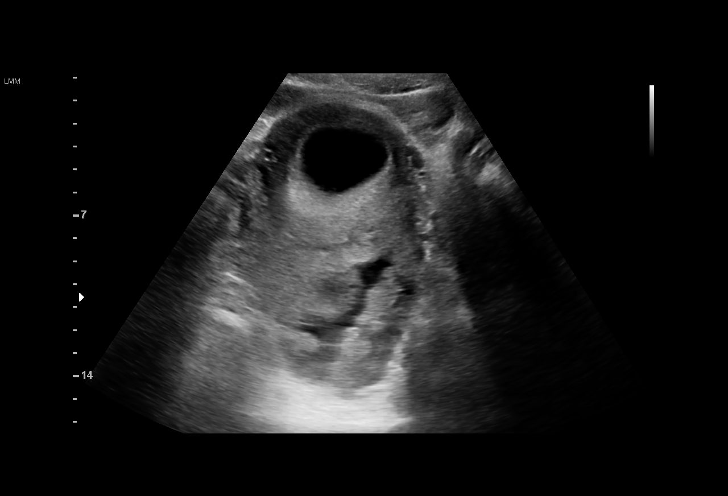
[im 46/50]
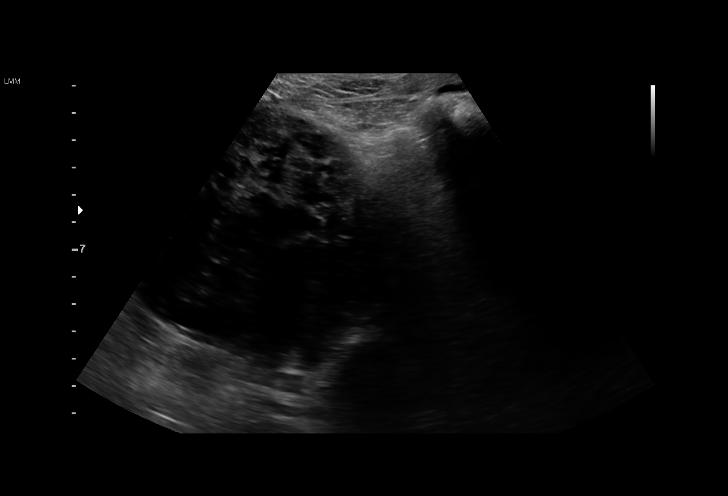
[im 50/50]
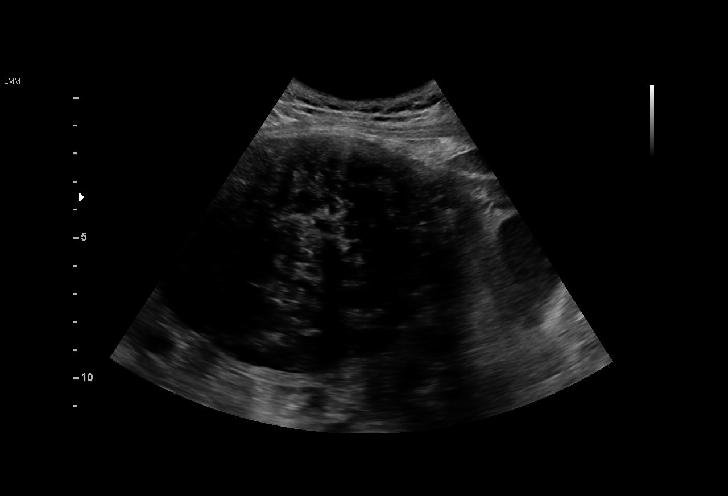

[15 of 28 positions shown; findings below may reference images not displayed]

FINDINGS: Intrauterine gestational sac: Single

Yolk sac:  Visualized.

Embryo:  Visualized.

Cardiac Activity: Visualized.

Heart Rate: 198 bpm

CRL:   32  mm   10 w 0 d                  US EDC: 11/27/2017

Subchorionic hemorrhage:  None visualized.

Maternal uterus/adnexae:

Subchorionic hemorrhage: None

Right ovary: Normal

Left ovary: Normal

Other :Large fundal fibroid measures 10.6 x 8.5 x 8.7 cm. This
localizes site of patient's pain.

Free fluid:  Small amount of free fluid around the left ovary.
IMPRESSION: 1. Single living intrauterine gestation. The estimated gestational
age is 10 weeks and 0 days.
2. Large fundal fibroid which localizes site of patient's pain.
3. Small volume of free fluid noted within the pelvis.

## 2018-10-24 IMAGING — US US MFM OB LIMITED
1 series · 15 of 26 positions shown · non-contrast
Comparison: none

[Series 1: us mfm ob limited · 26 acquisitions, 15 frames shown]
[im 1/26]
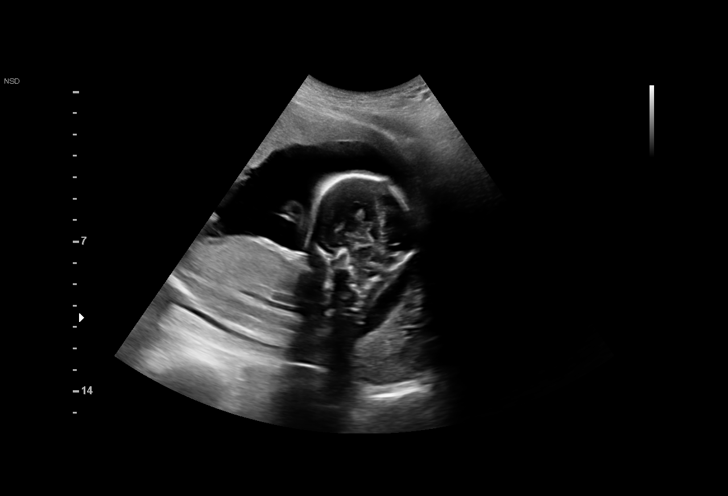
[im 3/26]
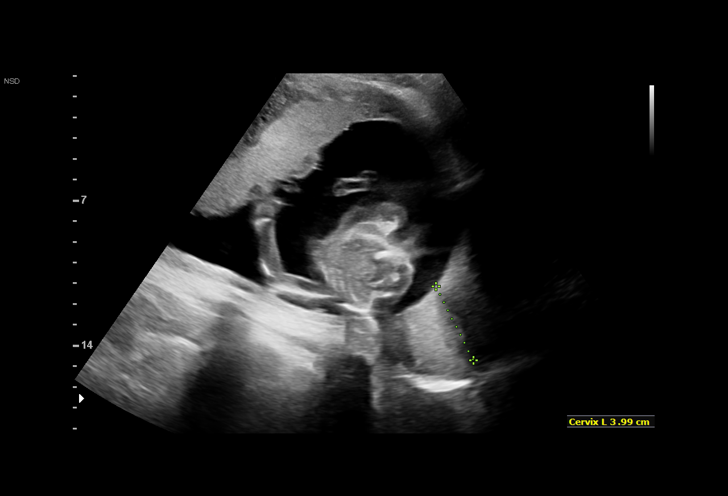
[im 5/26]
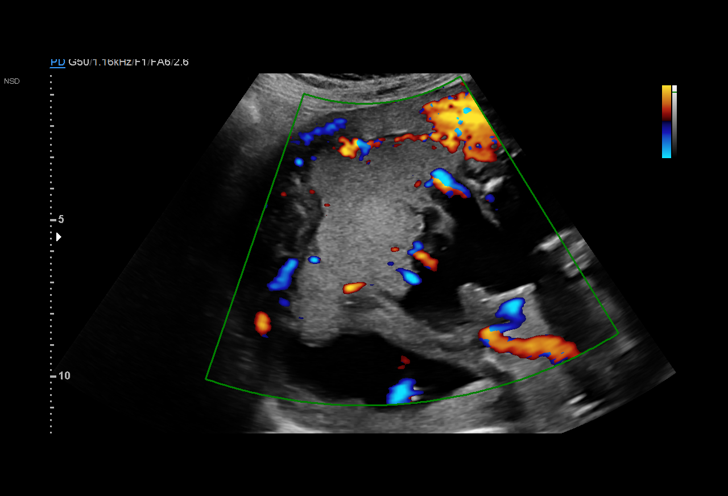
[im 7/26]
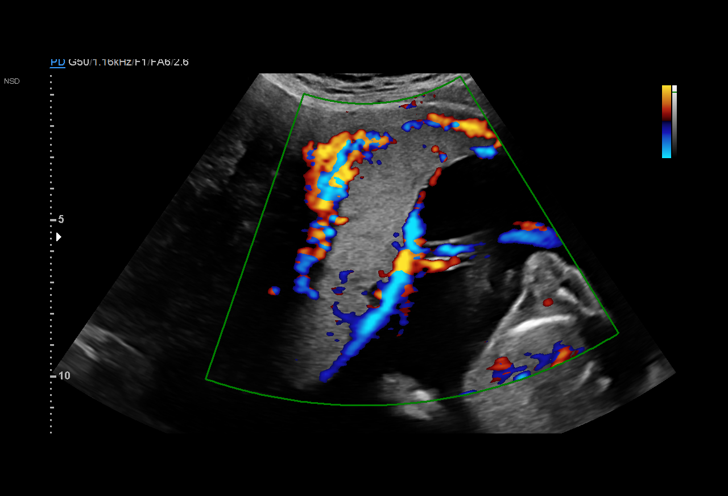
[im 8/26]
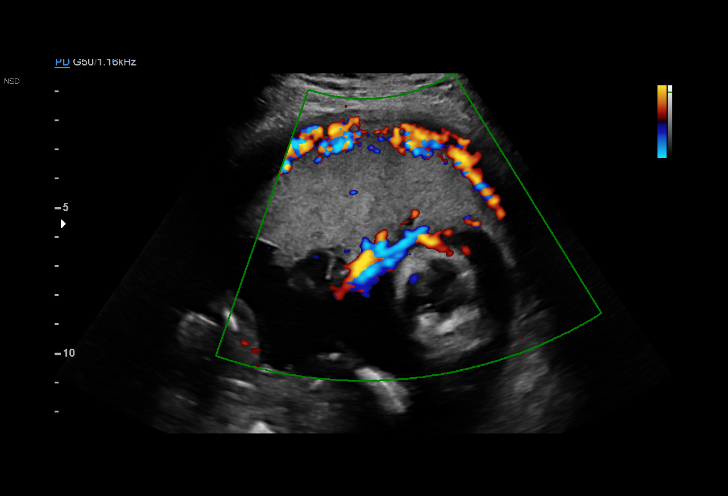
[im 10/26]
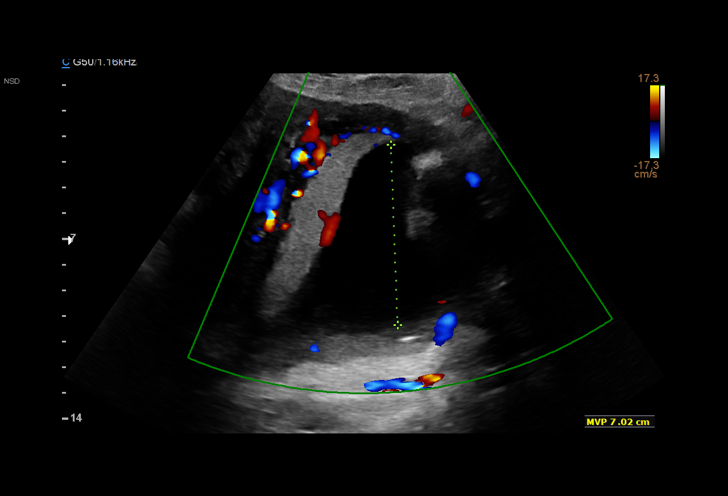
[im 12/26]
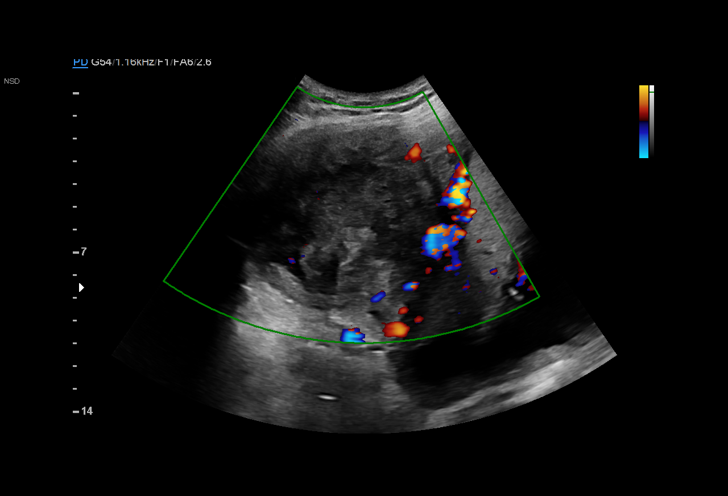
[im 14/26]
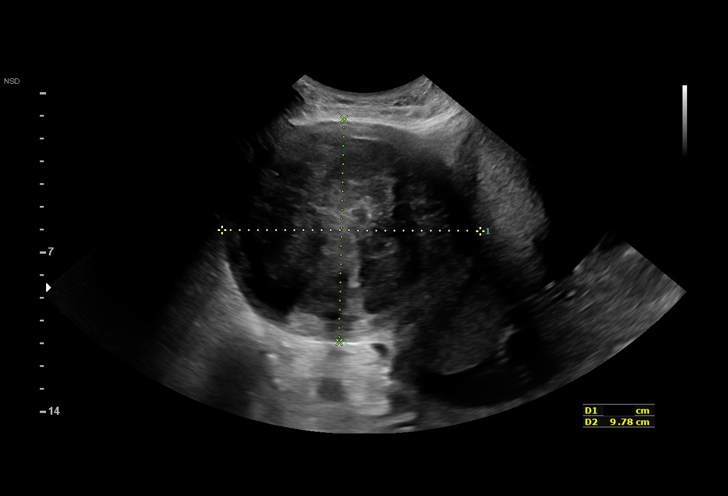
[im 15/26]
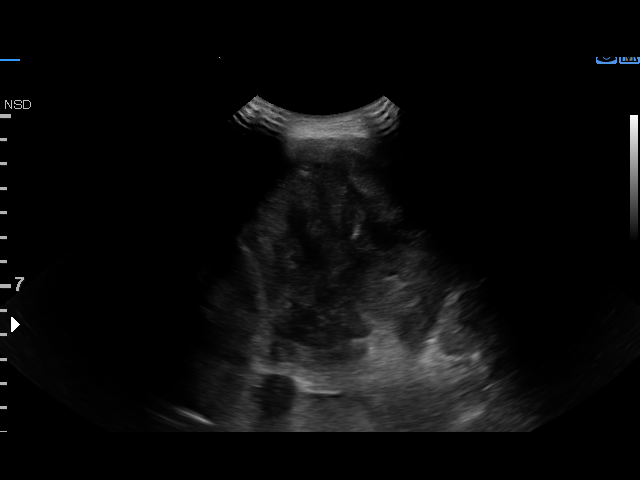
[im 17/26]
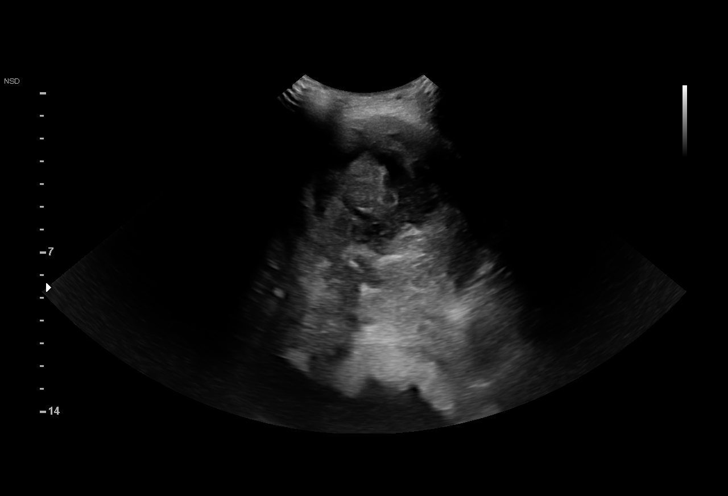
[im 19/26]
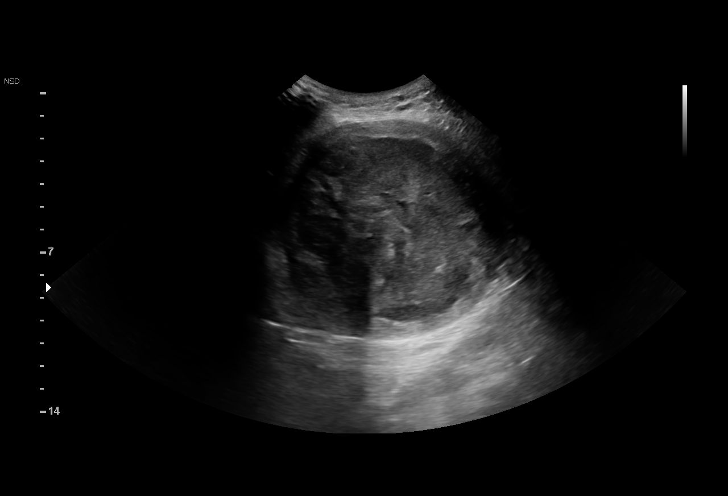
[im 20/26]
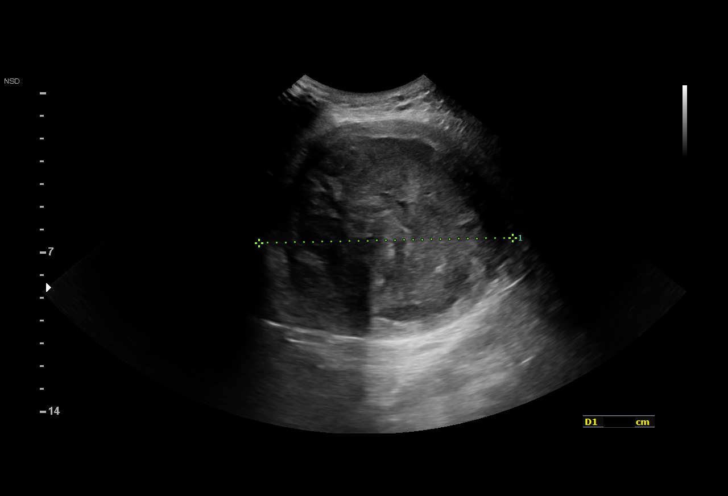
[im 22/26]
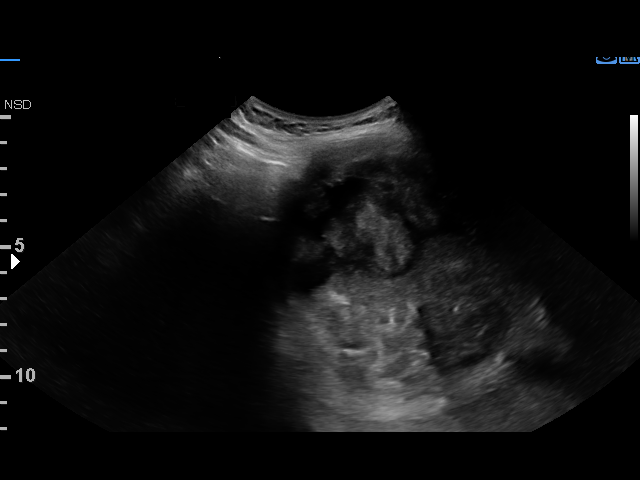
[im 24/26]
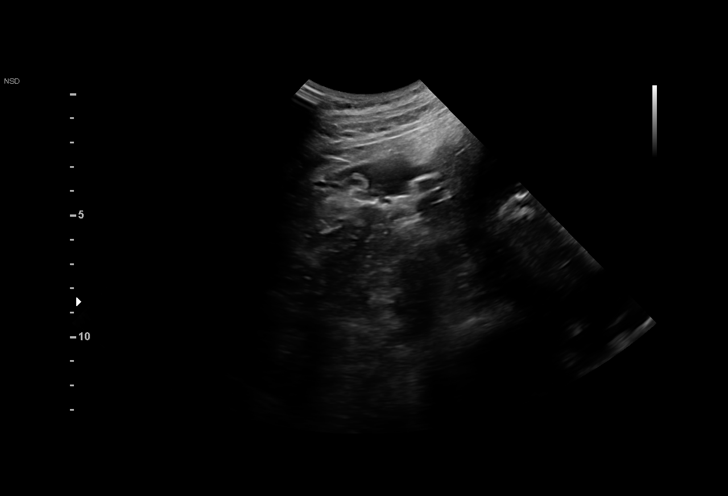
[im 26/26]
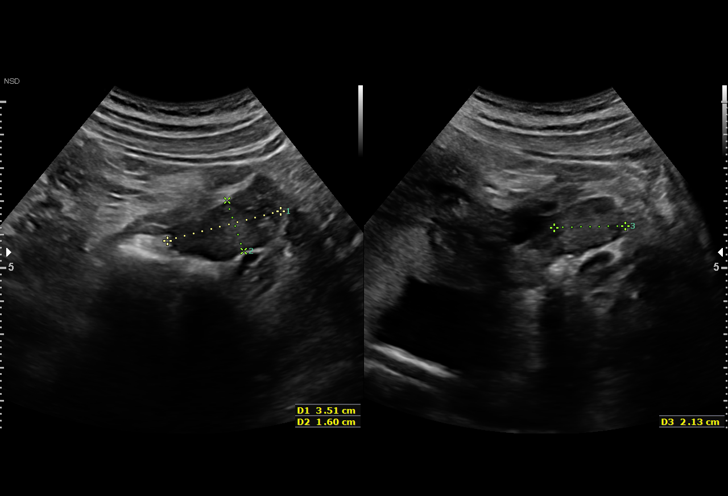

[15 of 26 positions shown; findings below may reference images not displayed]

Attending:        Tat Fu Hoi Chun       Secondary Phy.:    [facility]-
MAU/Triage

1  BELLA JUMPER             [id number]      [id number]     [id number]
Indications

20 weeks gestation of pregnancy
Abdominal pain in pregnancy (Rt adnexal
tenderness)
Uterine fibroids affecting pregnancy in        O34.12,
second trimester, antepartum
OB History

Gravidity:    1         Term:   0        Prem:   0        SAB:   0
TOP:          0       Ectopic:  0        Living: 0
Fetal Evaluation

Num Of Fetuses:     1
Fetal Heart         155
Rate(bpm):
Cardiac Activity:   Observed
Presentation:       Cephalic
Placenta:           Anterior, above cervical os
P. Cord Insertion:  Visualized

Amniotic Fluid
AFI FV:      Subjectively within normal limits

Largest Pocket(cm)
7.0
Gestational Age

Best:          20w 0d     Det. By:  Early Ultrasound         EDD:   11/29/17
Cervix Uterus Adnexa
Cervix
Length:              4  cm.
Normal appearance by transabdominal scan.

Uterus
Large fibroid noted, see table below.

Left Ovary
Within normal limits.

Right Ovary
Not visualized.

Adnexa:       No abnormality visualized.
Myomas

Site                     L(cm)      W(cm)      D(cm)       Location
Fundus

Blood Flow                 RI        PI       Comments
Area of patient's pain.
Aggravated by scanning
over superior portion.
Impression

SIUP at 71w1d (remote read only)
active fetus
cephalic presentation
cervix appears long and closed
no previa
amniotic fluid is gestational age appropriate
incidental note is made of a 77.0x77.7x8.8cm fundal myoma
with internal heterogeneity suggestive of degenerative
changes
Recommendations

Recommend formal fetal survey with serial biometry q4-6
weeks thereafter to plot interval growth during this gestation.

## 2018-11-16 NOTE — L&D Delivery Note (Signed)
Operative Delivery Note  At 11:19 PM a viable female was delivered via Vaginal, Spontaneous.  Presentation: vertex; Position: Right,, Occiput,, Anterior; Station: +2.  VBAC and Vacuum applied because of fetal bradycardia to the 60s  Verbal consent: obtained from patient.  Risks and benefits discussed in detail.  Risks include, but are not limited to the risks of anesthesia, bleeding, infection, damage to maternal tissues, fetal cephalhematoma.  There is also the risk of inability to effect vaginal delivery of the head, or shoulder dystocia that cannot be resolved by established maneuvers, leading to the need for emergency cesarean section.  APGAR: 8 9  weight  pending .   Placenta status: spontaneously with 3 vessel cord, .   Cord:  with the following complications:none .  Cord pH: not obtained  Anesthesia:  epidural Instruments: mushroom - easy with 2 pulls no popoffs Episiotomy: None Lacerations: 2nd degree Suture Repair: 3.0 chromic Est. Blood Loss (mL): 300  Mom to postpartum.  Baby to Couplet care / Skin to Skin.  Cyril Mourning 08/04/2019, 11:31 PM

## 2019-01-31 LAB — OB RESULTS CONSOLE HEPATITIS B SURFACE ANTIGEN: Hepatitis B Surface Ag: NEGATIVE

## 2019-01-31 LAB — OB RESULTS CONSOLE HIV ANTIBODY (ROUTINE TESTING): HIV: NONREACTIVE

## 2019-01-31 LAB — OB RESULTS CONSOLE ABO/RH: RH Type: POSITIVE

## 2019-01-31 LAB — OB RESULTS CONSOLE ANTIBODY SCREEN: Antibody Screen: NEGATIVE

## 2019-01-31 LAB — OB RESULTS CONSOLE GC/CHLAMYDIA
Chlamydia: NEGATIVE
Gonorrhea: NEGATIVE

## 2019-01-31 LAB — OB RESULTS CONSOLE RPR: RPR: NONREACTIVE

## 2019-01-31 LAB — OB RESULTS CONSOLE RUBELLA ANTIBODY, IGM: Rubella: IMMUNE

## 2019-07-06 ENCOUNTER — Other Ambulatory Visit: Payer: Self-pay

## 2019-07-06 ENCOUNTER — Inpatient Hospital Stay (HOSPITAL_COMMUNITY)
Admission: AD | Admit: 2019-07-06 | Discharge: 2019-07-06 | Disposition: A | Discharge status: Home / Self Care | Payer: 59 | Attending: Obstetrics and Gynecology | Admitting: Obstetrics and Gynecology

## 2019-07-06 ENCOUNTER — Encounter (HOSPITAL_COMMUNITY): Payer: Self-pay | Admitting: *Deleted

## 2019-07-06 DIAGNOSIS — O26893 Other specified pregnancy related conditions, third trimester: Secondary | ICD-10-CM

## 2019-07-06 DIAGNOSIS — O471 False labor at or after 37 completed weeks of gestation: Secondary | ICD-10-CM | POA: Insufficient documentation

## 2019-07-06 DIAGNOSIS — N898 Other specified noninflammatory disorders of vagina: Secondary | ICD-10-CM | POA: Diagnosis not present

## 2019-07-06 DIAGNOSIS — Z3A37 37 weeks gestation of pregnancy: Secondary | ICD-10-CM | POA: Diagnosis not present

## 2019-07-06 LAB — POCT FERN TEST: POCT Fern Test: NEGATIVE

## 2019-07-06 NOTE — MAU Note (Signed)
Pt stated she has some fluid leak out at 11:30 last night and 2 more times until 2am. No leaking today but noticing mucusy discharge when she wipes. Good fetal movement having occasional mild contractions

## 2019-07-06 NOTE — MAU Provider Note (Signed)
S: Ms. Erica Lucas is a 38 y.o. G2P1001 at [redacted]w[redacted]d  who presents to MAU today complaining of leaking of fluid since  Yesterday morning. She had three "gushes" of fluid throughout the day, last one was at 1 pm. She did not have to change her clothes, but her pantyliner did feel soaked later in the day. She denies vaginal bleeding. She denies contractions. She reports normal fetal movement.    O: BP 104/63   Pulse 87   Temp 98.2 F (36.8 C)   Resp 18   Ht 5\' 4"  (1.626 m)   Wt 84.4 kg   SpO2 100%   BMI 31.93 kg/m  GENERAL: Well-developed, well-nourished female in no acute distress.  HEAD: Normocephalic, atraumatic.  CHEST: Normal effort of breathing, regular heart rate ABDOMEN: Soft, nontender, gravid PELVIC: Normal external female genitalia. Vagina is pink and rugated. Cervix with normal contour, no lesions. Normal discharge.  No pooling.   Cervical exam: 0.5; long, thick, middle position.  Dilation: Fingertip Exam by:: K. Sterlin Knightly, CNM   Fetal Monitoring: Baseline: 150 Variability: mod Accelerations: present Decelerations: none, EFM was tracing mom's pulse at 1402 when she was in lithotomy.  Contractions: none  Fern test is negative Results for orders placed or performed during the hospital encounter of 07/06/19 (from the past 24 hour(s))  POCT fern test     Status: None   Collection Time: 07/06/19  1:49 PM  Result Value Ref Range   POCT Fern Test Negative = intact amniotic membranes      A: SIUP at [redacted]w[redacted]d  Membranes intact  P: Report given to RN to contact MD on call for further instructions  Starr Lake, CNM 07/06/2019 2:51 PM  Erica Lucas 07/06/2019, 2:51 PM

## 2019-07-06 NOTE — Discharge Instructions (Signed)
Third Trimester of Pregnancy The third trimester is from week 28 through week 40 (months 7 through 9). The third trimester is a time when the unborn baby (fetus) is growing rapidly. At the end of the ninth month, the fetus is about 20 inches in length and weighs 6-10 pounds. Body changes during your third trimester Your body will continue to go through many changes during pregnancy. The changes vary from woman to woman. During the third trimester:  Your weight will continue to increase. You can expect to gain 25-35 pounds (11-16 kg) by the end of the pregnancy.  You may begin to get stretch marks on your hips, abdomen, and breasts.  You may urinate more often because the fetus is moving lower into your pelvis and pressing on your bladder.  You may develop or continue to have heartburn. This is caused by increased hormones that slow down muscles in the digestive tract.  You may develop or continue to have constipation because increased hormones slow digestion and cause the muscles that push waste through your intestines to relax.  You may develop hemorrhoids. These are swollen veins (varicose veins) in the rectum that can itch or be painful.  You may develop swollen, bulging veins (varicose veins) in your legs.  You may have increased body aches in the pelvis, back, or thighs. This is due to weight gain and increased hormones that are relaxing your joints.  You may have changes in your hair. These can include thickening of your hair, rapid growth, and changes in texture. Some women also have hair loss during or after pregnancy, or hair that feels dry or thin. Your hair will most likely return to normal after your baby is born.  Your breasts will continue to grow and they will continue to become tender. A yellow fluid (colostrum) may leak from your breasts. This is the first milk you are producing for your baby.  Your belly button may stick out.  You may notice more swelling in your hands,  face, or ankles.  You may have increased tingling or numbness in your hands, arms, and legs. The skin on your belly may also feel numb.  You may feel short of breath because of your expanding uterus.  You may have more problems sleeping. This can be caused by the size of your belly, increased need to urinate, and an increase in your body's metabolism.  You may notice the fetus "dropping," or moving lower in your abdomen (lightening).  You may have increased vaginal discharge.  You may notice your joints feel loose and you may have pain around your pelvic bone. What to expect at prenatal visits You will have prenatal exams every 2 weeks until week 36. Then you will have weekly prenatal exams. During a routine prenatal visit:  You will be weighed to make sure you and the baby are growing normally.  Your blood pressure will be taken.  Your abdomen will be measured to track your baby's growth.  The fetal heartbeat will be listened to.  Any test results from the previous visit will be discussed.  You may have a cervical check near your due date to see if your cervix has softened or thinned (effaced).  You will be tested for Group B streptococcus. This happens between 35 and 37 weeks. Your health care provider may ask you:  What your birth plan is.  How you are feeling.  If you are feeling the baby move.  If you have had any abnormal  symptoms, such as leaking fluid, bleeding, severe headaches, or abdominal cramping.  If you are using any tobacco products, including cigarettes, chewing tobacco, and electronic cigarettes.  If you have any questions. Other tests or screenings that may be performed during your third trimester include:  Blood tests that check for low iron levels (anemia).  Fetal testing to check the health, activity level, and growth of the fetus. Testing is done if you have certain medical conditions or if there are problems during the pregnancy.  Nonstress test  (NST). This test checks the health of your baby to make sure there are no signs of problems, such as the baby not getting enough oxygen. During this test, a belt is placed around your belly. The baby is made to move, and its heart rate is monitored during movement. What is false labor? False labor is a condition in which you feel small, irregular tightenings of the muscles in the womb (contractions) that usually go away with rest, changing position, or drinking water. These are called Braxton Hicks contractions. Contractions may last for hours, days, or even weeks before true labor sets in. If contractions come at regular intervals, become more frequent, increase in intensity, or become painful, you should see your health care provider. What are the signs of labor?  Abdominal cramps.  Regular contractions that start at 10 minutes apart and become stronger and more frequent with time.  Contractions that start on the top of the uterus and spread down to the lower abdomen and back.  Increased pelvic pressure and dull back pain.  A watery or bloody mucus discharge that comes from the vagina.  Leaking of amniotic fluid. This is also known as your "water breaking." It could be a slow trickle or a gush. Let your health care provider know if it has a color or strange odor. If you have any of these signs, call your health care provider right away, even if it is before your due date. Follow these instructions at home: Medicines  Follow your health care provider's instructions regarding medicine use. Specific medicines may be either safe or unsafe to take during pregnancy.  Take a prenatal vitamin that contains at least 600 micrograms (mcg) of folic acid.  If you develop constipation, try taking a stool softener if your health care provider approves. Eating and drinking   Eat a balanced diet that includes fresh fruits and vegetables, whole grains, good sources of protein such as meat, eggs, or tofu,  and low-fat dairy. Your health care provider will help you determine the amount of weight gain that is right for you.  Avoid raw meat and uncooked cheese. These carry germs that can cause birth defects in the baby.  If you have low calcium intake from food, talk to your health care provider about whether you should take a daily calcium supplement.  Eat four or five small meals rather than three large meals a day.  Limit foods that are high in fat and processed sugars, such as fried and sweet foods.  To prevent constipation: ? Drink enough fluid to keep your urine clear or pale yellow. ? Eat foods that are high in fiber, such as fresh fruits and vegetables, whole grains, and beans. Activity  Exercise only as directed by your health care provider. Most women can continue their usual exercise routine during pregnancy. Try to exercise for 30 minutes at least 5 days a week. Stop exercising if you experience uterine contractions.  Avoid heavy lifting.  Do  not exercise in extreme heat or humidity, or at high altitudes.  Wear low-heel, comfortable shoes.  Practice good posture.  You may continue to have sex unless your health care provider tells you otherwise. Relieving pain and discomfort  Take frequent breaks and rest with your legs elevated if you have leg cramps or low back pain.  Take warm sitz baths to soothe any pain or discomfort caused by hemorrhoids. Use hemorrhoid cream if your health care provider approves.  Wear a good support bra to prevent discomfort from breast tenderness.  If you develop varicose veins: ? Wear support pantyhose or compression stockings as told by your healthcare provider. ? Elevate your feet for 15 minutes, 3-4 times a day. Prenatal care  Write down your questions. Take them to your prenatal visits.  Keep all your prenatal visits as told by your health care provider. This is important. Safety  Wear your seat belt at all times when driving.  Make  a list of emergency phone numbers, including numbers for family, friends, the hospital, and police and fire departments. General instructions  Avoid cat litter boxes and soil used by cats. These carry germs that can cause birth defects in the baby. If you have a cat, ask someone to clean the litter box for you.  Do not travel far distances unless it is absolutely necessary and only with the approval of your health care provider.  Do not use hot tubs, steam rooms, or saunas.  Do not drink alcohol.  Do not use any products that contain nicotine or tobacco, such as cigarettes and e-cigarettes. If you need help quitting, ask your health care provider.  Do not use any medicinal herbs or unprescribed drugs. These chemicals affect the formation and growth of the baby.  Do not douche or use tampons or scented sanitary pads.  Do not cross your legs for long periods of time.  To prepare for the arrival of your baby: ? Take prenatal classes to understand, practice, and ask questions about labor and delivery. ? Make a trial run to the hospital. ? Visit the hospital and tour the maternity area. ? Arrange for maternity or paternity leave through employers. ? Arrange for family and friends to take care of pets while you are in the hospital. ? Purchase a rear-facing car seat and make sure you know how to install it in your car. ? Pack your hospital bag. ? Prepare the babys nursery. Make sure to remove all pillows and stuffed animals from the baby's crib to prevent suffocation.  Visit your dentist if you have not gone during your pregnancy. Use a soft toothbrush to brush your teeth and be gentle when you floss. Contact a health care provider if:  You are unsure if you are in labor or if your water has broken.  You become dizzy.  You have mild pelvic cramps, pelvic pressure, or nagging pain in your abdominal area.  You have lower back pain.  You have persistent nausea, vomiting, or  diarrhea.  You have an unusual or bad smelling vaginal discharge.  You have pain when you urinate. Get help right away if:  Your water breaks before 37 weeks.  You have regular contractions less than 5 minutes apart before 37 weeks.  You have a fever.  You are leaking fluid from your vagina.  You have spotting or bleeding from your vagina.  You have severe abdominal pain or cramping.  You have rapid weight loss or weight gain.  You have  shortness of breath with chest pain.  You notice sudden or extreme swelling of your face, hands, ankles, feet, or legs.  Your baby makes fewer than 10 movements in 2 hours.  You have severe headaches that do not go away when you take medicine.  You have vision changes. Summary  The third trimester is from week 28 through week 40, months 7 through 9. The third trimester is a time when the unborn baby (fetus) is growing rapidly.  During the third trimester, your discomfort may increase as you and your baby continue to gain weight. You may have abdominal, leg, and back pain, sleeping problems, and an increased need to urinate.  During the third trimester your breasts will keep growing and they will continue to become tender. A yellow fluid (colostrum) may leak from your breasts. This is the first milk you are producing for your baby.  False labor is a condition in which you feel small, irregular tightenings of the muscles in the womb (contractions) that eventually go away. These are called Braxton Hicks contractions. Contractions may last for hours, days, or even weeks before true labor sets in.  Signs of labor can include: abdominal cramps; regular contractions that start at 10 minutes apart and become stronger and more frequent with time; watery or bloody mucus discharge that comes from the vagina; increased pelvic pressure and dull back pain; and leaking of amniotic fluid. This information is not intended to replace advice given to you by your  health care provider. Make sure you discuss any questions you have with your health care provider. Document Released: 10/27/2001 Document Revised: 02/23/2019 Document Reviewed: 12/08/2016 Elsevier Patient Education  2020 [employer].

## 2019-07-06 NOTE — MAU Note (Deleted)
Patient had a headache last night around 1800 and took 325mg  of Tylenol and went to bed.  Woke up with HA this AM and too 650mg  of Tylenol at 0600.  Meds did not help.  BP at home at 1130 was 139/85.  Pt vomited x1 this AM, but no longer nauseated.  Denies diarrhea.  Reports good fetal movement.

## 2019-07-12 LAB — OB RESULTS CONSOLE GBS: GBS: NEGATIVE

## 2019-07-26 ENCOUNTER — Telehealth (HOSPITAL_COMMUNITY): Payer: Self-pay | Admitting: *Deleted

## 2019-07-26 NOTE — Telephone Encounter (Signed)
Preadmission screen  

## 2019-07-27 ENCOUNTER — Telehealth (HOSPITAL_COMMUNITY): Payer: Self-pay | Admitting: *Deleted

## 2019-07-27 NOTE — Telephone Encounter (Signed)
Preadmission screen  

## 2019-07-28 ENCOUNTER — Telehealth (HOSPITAL_COMMUNITY): Payer: Self-pay | Admitting: *Deleted

## 2019-07-28 NOTE — Telephone Encounter (Signed)
Preadmission screen  

## 2019-07-31 ENCOUNTER — Telehealth (HOSPITAL_COMMUNITY): Payer: Self-pay | Admitting: *Deleted

## 2019-07-31 ENCOUNTER — Encounter (HOSPITAL_COMMUNITY): Payer: Self-pay | Admitting: *Deleted

## 2019-07-31 NOTE — Telephone Encounter (Signed)
Preadmission screen  

## 2019-08-02 ENCOUNTER — Other Ambulatory Visit: Payer: Self-pay | Admitting: Advanced Practice Midwife

## 2019-08-04 ENCOUNTER — Other Ambulatory Visit: Payer: Self-pay

## 2019-08-04 ENCOUNTER — Inpatient Hospital Stay (HOSPITAL_COMMUNITY): Payer: 59 | Admitting: Anesthesiology

## 2019-08-04 ENCOUNTER — Inpatient Hospital Stay (HOSPITAL_COMMUNITY)
Admission: AD | Admit: 2019-08-04 | Discharge: 2019-08-06 | DRG: 807 | Disposition: A | Discharge status: Home / Self Care | Payer: 59 | Attending: Obstetrics and Gynecology | Admitting: Obstetrics and Gynecology

## 2019-08-04 ENCOUNTER — Encounter (HOSPITAL_COMMUNITY): Payer: Self-pay | Admitting: Anesthesiology

## 2019-08-04 DIAGNOSIS — Z3A41 41 weeks gestation of pregnancy: Secondary | ICD-10-CM | POA: Diagnosis not present

## 2019-08-04 DIAGNOSIS — O480 Post-term pregnancy: Secondary | ICD-10-CM | POA: Diagnosis present

## 2019-08-04 DIAGNOSIS — O26893 Other specified pregnancy related conditions, third trimester: Secondary | ICD-10-CM | POA: Diagnosis present

## 2019-08-04 DIAGNOSIS — O34219 Maternal care for unspecified type scar from previous cesarean delivery: Secondary | ICD-10-CM | POA: Diagnosis present

## 2019-08-04 DIAGNOSIS — Z20828 Contact with and (suspected) exposure to other viral communicable diseases: Secondary | ICD-10-CM | POA: Diagnosis present

## 2019-08-04 LAB — CBC
HCT: 39.9 % (ref 36.0–46.0)
Hemoglobin: 12.5 g/dL (ref 12.0–15.0)
MCH: 28 pg (ref 26.0–34.0)
MCHC: 31.3 g/dL (ref 30.0–36.0)
MCV: 89.5 fL (ref 80.0–100.0)
Platelets: 192 10*3/uL (ref 150–400)
RBC: 4.46 MIL/uL (ref 3.87–5.11)
RDW: 17.2 % — ABNORMAL HIGH (ref 11.5–15.5)
WBC: 10.2 10*3/uL (ref 4.0–10.5)
nRBC: 0 % (ref 0.0–0.2)

## 2019-08-04 LAB — TYPE AND SCREEN
ABO/RH(D): B POS
Antibody Screen: NEGATIVE

## 2019-08-04 LAB — ABO/RH: ABO/RH(D): B POS

## 2019-08-04 LAB — POCT FERN TEST: POCT Fern Test: POSITIVE

## 2019-08-04 LAB — SARS CORONAVIRUS 2 BY RT PCR (HOSPITAL ORDER, PERFORMED IN ~~LOC~~ HOSPITAL LAB): SARS Coronavirus 2: NEGATIVE

## 2019-08-04 MED ORDER — PHENYLEPHRINE 40 MCG/ML (10ML) SYRINGE FOR IV PUSH (FOR BLOOD PRESSURE SUPPORT)
80.0000 ug | PREFILLED_SYRINGE | INTRAVENOUS | Status: DC | PRN
  Administered 2019-08-04: 80 ug via INTRAVENOUS

## 2019-08-04 MED ORDER — EPHEDRINE 5 MG/ML INJ: 10.0000 mg | INTRAVENOUS | Status: DC | PRN

## 2019-08-04 MED ORDER — LACTATED RINGERS IV SOLN
INTRAVENOUS | Status: DC
  Administered 2019-08-04: 22:00:00 via INTRAVENOUS

## 2019-08-04 MED ORDER — OXYCODONE-ACETAMINOPHEN 5-325 MG PO TABS: 1.0000 | ORAL_TABLET | ORAL | Status: DC | PRN

## 2019-08-04 MED ORDER — OXYTOCIN 40 UNITS IN NORMAL SALINE INFUSION - SIMPLE MED
2.5000 [IU]/h | INTRAVENOUS | Status: DC
  Administered 2019-08-04: 2.5 [IU]/h via INTRAVENOUS

## 2019-08-04 MED ORDER — LACTATED RINGERS IV SOLN: 500.0000 mL | Freq: Once | INTRAVENOUS | Status: DC

## 2019-08-04 MED ORDER — PHENYLEPHRINE 40 MCG/ML (10ML) SYRINGE FOR IV PUSH (FOR BLOOD PRESSURE SUPPORT): 80.0000 ug | PREFILLED_SYRINGE | INTRAVENOUS | Status: DC | PRN

## 2019-08-04 MED ORDER — ONDANSETRON HCL 4 MG/2ML IJ SOLN: 4.0000 mg | Freq: Four times a day (QID) | INTRAMUSCULAR | Status: DC | PRN

## 2019-08-04 MED ORDER — LACTATED RINGERS IV SOLN
500.0000 mL | Freq: Once | INTRAVENOUS | Status: AC
  Administered 2019-08-04: 23:00:00 500 mL/h via INTRAVENOUS

## 2019-08-04 MED ORDER — OXYCODONE-ACETAMINOPHEN 5-325 MG PO TABS: 2.0000 | ORAL_TABLET | ORAL | Status: DC | PRN

## 2019-08-04 MED ORDER — SOD CITRATE-CITRIC ACID 500-334 MG/5ML PO SOLN: 30.0000 mL | ORAL | Status: DC | PRN

## 2019-08-04 MED ORDER — DIPHENHYDRAMINE HCL 50 MG/ML IJ SOLN: 12.5000 mg | INTRAMUSCULAR | Status: DC | PRN

## 2019-08-04 MED ORDER — LACTATED RINGERS IV SOLN: 500.0000 mL | INTRAVENOUS | Status: DC | PRN

## 2019-08-04 MED ORDER — PHENYLEPHRINE 40 MCG/ML (10ML) SYRINGE FOR IV PUSH (FOR BLOOD PRESSURE SUPPORT)
80.0000 ug | PREFILLED_SYRINGE | INTRAVENOUS | Status: DC | PRN
  Filled 2019-08-04: qty 10

## 2019-08-04 MED ORDER — FENTANYL-BUPIVACAINE-NACL 0.5-0.125-0.9 MG/250ML-% EP SOLN: 12.0000 mL/h | EPIDURAL | Status: DC | PRN

## 2019-08-04 MED ORDER — FENTANYL-BUPIVACAINE-NACL 0.5-0.125-0.9 MG/250ML-% EP SOLN
12.0000 mL/h | EPIDURAL | Status: DC | PRN
  Filled 2019-08-04: qty 250

## 2019-08-04 MED ORDER — OXYTOCIN BOLUS FROM INFUSION: 500.0000 mL | Freq: Once | INTRAVENOUS | Status: DC

## 2019-08-04 MED ORDER — FLEET ENEMA 7-19 GM/118ML RE ENEM: 1.0000 | ENEMA | RECTAL | Status: DC | PRN

## 2019-08-04 MED ORDER — ACETAMINOPHEN 325 MG PO TABS: 650.0000 mg | ORAL_TABLET | ORAL | Status: DC | PRN

## 2019-08-04 MED ORDER — LIDOCAINE HCL (PF) 1 % IJ SOLN: 30.0000 mL | INTRAMUSCULAR | Status: DC | PRN

## 2019-08-04 MED ORDER — OXYTOCIN 40 UNITS IN NORMAL SALINE INFUSION - SIMPLE MED
INTRAVENOUS | Status: AC
  Filled 2019-08-04: qty 1000

## 2019-08-04 MED ORDER — LIDOCAINE-EPINEPHRINE (PF) 2 %-1:200000 IJ SOLN
INTRAMUSCULAR | Status: DC | PRN
  Administered 2019-08-04 (×3): 5 mL via EPIDURAL

## 2019-08-04 NOTE — Anesthesia Preprocedure Evaluation (Signed)
Anesthesia Evaluation  Patient identified by MRN, date of birth, ID band Patient awake    Reviewed: Allergy & Precautions, Patient's Chart, lab work & pertinent test results  History of Anesthesia Complications (+) history of anesthetic complications  Airway Mallampati: III  TM Distance: >3 FB Neck ROM: Full    Dental no notable dental hx. (+) Teeth Intact   Pulmonary neg pulmonary ROS,    Pulmonary exam normal breath sounds clear to auscultation       Cardiovascular negative cardio ROS Normal cardiovascular exam Rhythm:Regular Rate:Normal     Neuro/Psych negative neurological ROS  negative psych ROS   GI/Hepatic Neg liver ROS, GERD  ,  Endo/Other  negative endocrine ROS  Renal/GU negative Renal ROS  negative genitourinary   Musculoskeletal   Abdominal   Peds  Hematology negative hematology ROS (+)   Anesthesia Other Findings   Reproductive/Obstetrics (+) Pregnancy Previous C/Section AMA                             Anesthesia Physical Anesthesia Plan  ASA: II  Anesthesia Plan: Epidural   Post-op Pain Management:    Induction:   PONV Risk Score and Plan:   Airway Management Planned: Natural Airway  Additional Equipment:   Intra-op Plan:   Post-operative Plan:   Informed Consent: I have reviewed the patients History and Physical, chart, labs and discussed the procedure including the risks, benefits and alternatives for the proposed anesthesia with the patient or authorized representative who has indicated his/her understanding and acceptance.       Plan Discussed with: Anesthesiologist  Anesthesia Plan Comments:         Anesthesia Quick Evaluation

## 2019-08-04 NOTE — H&P (Signed)
Erica Lucas is a 38 year old G 2 P 1 at 75 w 1 day presents in active labor History of C Section and is for TOLAC By the time she arrived to L and D she was 9.5 cm dilated OB History    Gravida  2   Para  1   Term  1   Preterm  0   AB  0   Living  1     SAB  0   TAB  0   Ectopic  0   Multiple  0   Live Births  1          Past Medical History:  Diagnosis Date  . AMA (advanced maternal age) primigravida 54+   . Complication of anesthesia    severe itching  . Fibroid   . Medical history non-contributory   . UTI (urinary tract infection)    Past Surgical History:  Procedure Laterality Date  . CESAREAN SECTION N/A 12/06/2017   Procedure: CESAREAN SECTION;  Surgeon: Tyson Dense, MD;  Location: [facility];  Service: Obstetrics;  Laterality: N/A;  . NO PAST SURGERIES     Family History: family history includes Diabetes in her mother; Hypertension in her mother. Social History:  reports that she has never smoked. She has never used smokeless tobacco. She reports that she does not drink alcohol or use drugs.     Maternal Diabetes: No Genetic Screening: Normal Maternal Ultrasounds/Referrals: Normal Fetal Ultrasounds or other Referrals:  None Maternal Substance Abuse:  No Significant Maternal Medications:  None Significant Maternal Lab Results:  None Other Comments:  None  Review of Systems  All other systems reviewed and are negative.  Maternal Medical History:  Reason for admission: Contractions.     Dilation: Lip/rim Effacement (%): 100 Station: 0 Exam by:: B Price RN  unknown if currently breastfeeding. Maternal Exam:  Uterine Assessment: Contraction strength is moderate.  Contraction frequency is regular.   Abdomen: Surgical scars: low transverse.   Fetal presentation: vertex     Fetal Exam Fetal State Assessment: Category I - tracings are normal.     Physical Exam  Nursing note and vitals reviewed. Constitutional:  She appears well-developed and well-nourished.  HENT:  Head: Normocephalic and atraumatic.  Eyes: Pupils are equal, round, and reactive to light.  Neck: Normal range of motion.  Cardiovascular: Normal rate and regular rhythm.  Respiratory: Effort normal.  GI: Soft.    Prenatal labs: ABO, Rh: --/--/PENDING (09/18 2209) Antibody: PENDING (09/18 2209) Rubella: Immune (03/17 0000) RPR: Nonreactive (03/17 0000)  HBsAg: Negative (03/17 0000)  HIV: Non-reactive (03/17 0000)  GBS: Negative/-- (08/26 0000)   Assessment/Plan: IUP at term  Labor Previous C Section - patient has been extensively counseled about risks of TOLAC  Epidural now  Anticipate vaginal delivery   Cyril Mourning 08/04/2019, 10:58 PM

## 2019-08-04 NOTE — MAU Note (Signed)
Pt reports to MAU c/o ctx every few min. Pt is having vaginal bleeding. Pt states her water broke around 1800 clear fluid. Pt unsure of FM due to pain.

## 2019-08-04 NOTE — MAU Note (Signed)
Water broke at 8pm contractions followed now very strong. assiste pt to bed. As checked 4.5 dildated SROM

## 2019-08-04 NOTE — Anesthesia Procedure Notes (Signed)
Epidural Patient location during procedure: OB Start time: 08/04/2019 10:57 PM End time: 08/04/2019 11:05 PM  Staffing Anesthesiologist: Josephine Igo, MD Performed: anesthesiologist   Preanesthetic Checklist Completed: patient identified, site marked, surgical consent, pre-op evaluation, timeout performed, IV checked, risks and benefits discussed and monitors and equipment checked  Epidural Patient position: left lateral decubitus Prep: site prepped and draped and DuraPrep Patient monitoring: continuous pulse ox and blood pressure Approach: midline Location: L4-L5 Injection technique: LOR air  Needle:  Needle type: Tuohy  Needle gauge: 17 G Needle length: 9 cm and 9 Needle insertion depth: 7 cm Catheter type: closed end flexible Catheter size: 19 Gauge Catheter at skin depth: 12 cm Test dose: negative and Other  Assessment Events: blood not aspirated, injection not painful, no injection resistance, negative IV test and no paresthesia  Additional Notes Patient identified. Risks and benefits discussed including failed block, incomplete  Pain control, post dural puncture headache, nerve damage, paralysis, blood pressure Changes, nausea, vomiting, reactions to medications-both toxic and allergic and post Partum back pain. All questions were answered. Patient expressed understanding and wished to proceed. Sterile technique was used throughout procedure. Epidural site was Dressed with sterile barrier dressing. No paresthesias, signs of intravascular injection Or signs of intrathecal spread were encountered.  Patient was more comfortable after the epidural was dosed. Please see RN's note for documentation of vital signs and FHR which are stable. Reason for block:procedure for pain

## 2019-08-05 ENCOUNTER — Other Ambulatory Visit: Payer: Self-pay

## 2019-08-05 ENCOUNTER — Encounter (HOSPITAL_COMMUNITY): Payer: Self-pay

## 2019-08-05 DIAGNOSIS — O34219 Maternal care for unspecified type scar from previous cesarean delivery: Secondary | ICD-10-CM | POA: Diagnosis present

## 2019-08-05 LAB — CBC
HCT: 35 % — ABNORMAL LOW (ref 36.0–46.0)
Hemoglobin: 11.6 g/dL — ABNORMAL LOW (ref 12.0–15.0)
MCH: 29 pg (ref 26.0–34.0)
MCHC: 33.1 g/dL (ref 30.0–36.0)
MCV: 87.5 fL (ref 80.0–100.0)
Platelets: 170 10*3/uL (ref 150–400)
RBC: 4 MIL/uL (ref 3.87–5.11)
RDW: 17 % — ABNORMAL HIGH (ref 11.5–15.5)
WBC: 12.8 10*3/uL — ABNORMAL HIGH (ref 4.0–10.5)
nRBC: 0 % (ref 0.0–0.2)

## 2019-08-05 LAB — RPR: RPR Ser Ql: NONREACTIVE

## 2019-08-05 MED ORDER — WITCH HAZEL-GLYCERIN EX PADS: 1.0000 "application " | MEDICATED_PAD | CUTANEOUS | Status: DC | PRN

## 2019-08-05 MED ORDER — DIPHENHYDRAMINE HCL 25 MG PO CAPS: 25.0000 mg | ORAL_CAPSULE | Freq: Four times a day (QID) | ORAL | Status: DC | PRN

## 2019-08-05 MED ORDER — TETANUS-DIPHTH-ACELL PERTUSSIS 5-2.5-18.5 LF-MCG/0.5 IM SUSP: 0.5000 mL | Freq: Once | INTRAMUSCULAR | Status: DC

## 2019-08-05 MED ORDER — BISACODYL 10 MG RE SUPP: 10.0000 mg | Freq: Every day | RECTAL | Status: DC | PRN

## 2019-08-05 MED ORDER — MEDROXYPROGESTERONE ACETATE 150 MG/ML IM SUSP: 150.0000 mg | INTRAMUSCULAR | Status: DC | PRN

## 2019-08-05 MED ORDER — OXYCODONE HCL 5 MG PO TABS: 10.0000 mg | ORAL_TABLET | ORAL | Status: DC | PRN

## 2019-08-05 MED ORDER — FLEET ENEMA 7-19 GM/118ML RE ENEM: 1.0000 | ENEMA | Freq: Every day | RECTAL | Status: DC | PRN

## 2019-08-05 MED ORDER — ONDANSETRON HCL 4 MG/2ML IJ SOLN: 4.0000 mg | INTRAMUSCULAR | Status: DC | PRN

## 2019-08-05 MED ORDER — BENZOCAINE-MENTHOL 20-0.5 % EX AERO
1.0000 "application " | INHALATION_SPRAY | CUTANEOUS | Status: DC | PRN
  Administered 2019-08-05: 1 via TOPICAL
  Filled 2019-08-05: qty 56

## 2019-08-05 MED ORDER — SIMETHICONE 80 MG PO CHEW: 80.0000 mg | CHEWABLE_TABLET | ORAL | Status: DC | PRN

## 2019-08-05 MED ORDER — ACETAMINOPHEN 325 MG PO TABS: 650.0000 mg | ORAL_TABLET | ORAL | Status: DC | PRN

## 2019-08-05 MED ORDER — PRENATAL MULTIVITAMIN CH
1.0000 | ORAL_TABLET | Freq: Every day | ORAL | Status: DC
  Administered 2019-08-05: 1 via ORAL
  Filled 2019-08-05 (×2): qty 1

## 2019-08-05 MED ORDER — MEASLES, MUMPS & RUBELLA VAC IJ SOLR: 0.5000 mL | Freq: Once | INTRAMUSCULAR | Status: DC

## 2019-08-05 MED ORDER — COCONUT OIL OIL: 1.0000 "application " | TOPICAL_OIL | Status: DC | PRN

## 2019-08-05 MED ORDER — OXYCODONE HCL 5 MG PO TABS: 5.0000 mg | ORAL_TABLET | ORAL | Status: DC | PRN

## 2019-08-05 MED ORDER — DIBUCAINE (PERIANAL) 1 % EX OINT: 1.0000 "application " | TOPICAL_OINTMENT | CUTANEOUS | Status: DC | PRN

## 2019-08-05 MED ORDER — IBUPROFEN 600 MG PO TABS
600.0000 mg | ORAL_TABLET | Freq: Four times a day (QID) | ORAL | Status: DC
  Administered 2019-08-05 – 2019-08-06 (×5): 600 mg via ORAL
  Filled 2019-08-05 (×6): qty 1

## 2019-08-05 MED ORDER — SENNOSIDES-DOCUSATE SODIUM 8.6-50 MG PO TABS
2.0000 | ORAL_TABLET | ORAL | Status: DC
  Administered 2019-08-05 – 2019-08-06 (×2): 2 via ORAL
  Filled 2019-08-05 (×2): qty 2

## 2019-08-05 MED ORDER — ONDANSETRON HCL 4 MG PO TABS: 4.0000 mg | ORAL_TABLET | ORAL | Status: DC | PRN

## 2019-08-05 MED ORDER — ZOLPIDEM TARTRATE 5 MG PO TABS: 5.0000 mg | ORAL_TABLET | Freq: Every evening | ORAL | Status: DC | PRN

## 2019-08-05 NOTE — Anesthesia Postprocedure Evaluation (Signed)
Anesthesia Post Note  Patient: Erica Lucas  Procedure(s) Performed: AN AD HOC LABOR EPIDURAL     Patient location during evaluation: Mother Baby Anesthesia Type: Epidural Level of consciousness: awake and alert, oriented and patient cooperative Pain management: pain level controlled Vital Signs Assessment: post-procedure vital signs reviewed and stable Respiratory status: spontaneous breathing Cardiovascular status: stable Postop Assessment: no headache, epidural receding, patient able to bend at knees and no signs of nausea or vomiting Anesthetic complications: no Comments: Pt. Interviewed via phone consultation d/t COVID 19 precautions. Pt states pain score 3.     Last Vitals:  Vitals:   08/05/19 0325 08/05/19 0735  BP: 130/70 122/67  Pulse: 92 79  Resp: 18 18  Temp: 36.9 C 36.8 C  SpO2: 99%     Last Pain:  Vitals:   08/05/19 0735  TempSrc: Oral  PainSc: 3    Pain Goal:                   [facility]

## 2019-08-05 NOTE — Lactation Note (Signed)
This note was copied from a baby's chart. Lactation Consultation Note  Patient Name: Erica Lucas [id number] Date: 08/05/2019 Reason for consult: Initial assessment;Term P2, 16 hour female infant. Mom's feeding choice at admission was breast and formula feeding. Mom is experienced at breastfeeding, she breastfed her 9 month old daughter for 6 months and she has a DEBP at home. Per mom, she feels breastfeeding is going well infant has been breastfeeding 20 to 30 minutes most feedings. Per mom, she has been feeding infant every 3 hours, LC discussed to feed infant according to  hunger cues and  to watch infant for hunger cues  not the clock, infant may fed 8 to 12 times within 24 hours and on demand. LC discussed with parents after 24 hours of life infant may cluster feed. Mom has been breastfeeding one feeding then bottle the next. LC suggested  to help mom establish good milk supply to  breastfed every feeding and then supplement with formula afterwards. Mom is agreeable with this suggestion and mom had breastfeeding supplemental paper which was given by Nurse. LC did not observe latch at this time due to mom breastfeeding infant less than 2 hours and infant asleep in basinet. Parents will continue to do as much STS as possible. Mom knows to call Nurse or [place] if she has any questions, concerns or need assistance with latching infant to breast.  Reviewed Baby & Me book's Breastfeeding Basics.  Mom made aware of O/P services, breastfeeding support groups, community resources, and our phone # for post-discharge questions.    Maternal Data Formula Feeding for Exclusion: Yes Reason for exclusion: Mother's choice to formula and breast feed on admission Has patient been taught Hand Expression?: Yes(Momhas colostrum present both breast.) Does the patient have breastfeeding experience prior to this delivery?: Yes  Feeding Feeding Type: Breast Fed  LATCH Score                    Interventions Interventions: Breast feeding basics reviewed;Skin to skin;Hand express  Lactation Tools Discussed/Used WIC Program: No   Consult Status Consult Status: Follow-up Date: 08/05/19 Follow-up type: In-patient    Vicente Serene 08/05/2019, 6:02 PM

## 2019-08-05 NOTE — Progress Notes (Signed)
Doing well  No complaints BP 122/67 (BP Location: Left Arm)   Pulse 79   Temp 98.2 F (36.8 C) (Oral)   Resp 18   Ht 5\' 7"  (1.702 m)   Wt 85.3 kg   SpO2 99%   BMI 29.44 kg/m  Abdomen soft and non tender  Ppd# 1  Vacuum  VBAC Routine care Discharge home tomorrow

## 2019-08-06 ENCOUNTER — Ambulatory Visit: Payer: Self-pay

## 2019-08-06 ENCOUNTER — Encounter (HOSPITAL_COMMUNITY): Payer: Self-pay | Admitting: *Deleted

## 2019-08-06 ENCOUNTER — Other Ambulatory Visit (HOSPITAL_COMMUNITY)
Admission: RE | Admit: 2019-08-06 | Discharge: 2019-08-06 | Disposition: A | Discharge status: Home / Self Care | Payer: 59 | Source: Ambulatory Visit

## 2019-08-06 NOTE — Lactation Note (Signed)
This note was copied from a baby's chart. Lactation Consultation Note: Mother breastfeeding infant when I arrived in the room, infant checks dimpling. Mother has infant in cradle hold with no support for infant.   Infant suckled for 15 mins.,observed that mothers nipple was pinched when infant released the breast. Mother described pain with the latch.  Informed mother that breastfeeding should not cause pain.  Mother reports that she had pain with her first child for several weeks.   Assessed infants mouth with a gloved finger. Infant has a high palate and short frenula.  Assist mother with latching using support pillows. Infant placed in football hold. Infant latched on with better depth. Assist with flanging infants lips.  Infant sustained latch for 10  Mins. A #5 fr feeding tube sat up and infant took 10 ml of formula.  Advised mother to post pump every 2-3 hours and supplement infant with her own milk.  Mother has a Medela pump at home . She was given a new kit. Discussed treatment and prevention of engorgement.  Mother to follow up with [facility] as needed.   Patient Name: Erica Lucas [id number] Date: 08/06/2019     Maternal Data    Feeding    LATCH Score                   Interventions    Lactation Tools Discussed/Used     Consult Status      Jess Barters [facility] 08/06/2019, 4:51 PM

## 2019-08-06 NOTE — Discharge Summary (Signed)
Obstetric Discharge Summary Reason for Admission: onset of labor Prenatal Procedures: none Intrapartum Procedures: vacuum Postpartum Procedures: none Complications-Operative and Postpartum: 2nd degree perineal laceration Hemoglobin  Date Value Ref Range Status  08/05/2019 11.6 (L) 12.0 - 15.0 g/dL Final   HCT  Date Value Ref Range Status  08/05/2019 35.0 (L) 36.0 - 46.0 % Final    Physical Exam:  General: alert, cooperative and appears stated age 38: appropriate Uterine Fundus: firm Incision: healing well, no significant drainage, no dehiscence, no significant erythema DVT Evaluation: No evidence of DVT seen on physical exam.  Discharge Diagnoses: Term Pregnancy-delivered  Discharge Information: Date: 08/06/2019 Activity: pelvic rest Diet: routine Medications: None Condition: improved Instructions: refer to practice specific booklet Discharge to: home   Newborn Data: Live born female  Birth Weight: 8 lb 10.3 oz (3920 g) APGAR: 8, 9  Newborn Delivery   Birth date/time: 08/04/2019 23:19:00 Delivery type: Vaginal, Spontaneous      Home with mother.  Cyril Mourning 08/06/2019, 8:49 AM

## 2019-08-08 ENCOUNTER — Inpatient Hospital Stay (HOSPITAL_COMMUNITY): Admission: AD | Admit: 2019-08-08 | Payer: 59 | Source: Home / Self Care | Admitting: Obstetrics and Gynecology

## 2019-08-08 ENCOUNTER — Inpatient Hospital Stay (HOSPITAL_COMMUNITY): Payer: 59
